# Patient Record
Sex: Female | Born: 1970 | Race: White | Hispanic: No | Marital: Married | Smoking: Current every day smoker
Health system: Southern US, Community
[De-identification: ages and names within clinical notes are randomized; demographics above are authoritative.]

## PROBLEM LIST (undated history)

## (undated) DIAGNOSIS — J449 Chronic obstructive pulmonary disease, unspecified: Secondary | ICD-10-CM

## (undated) DIAGNOSIS — I10 Essential (primary) hypertension: Secondary | ICD-10-CM

## (undated) DIAGNOSIS — C801 Malignant (primary) neoplasm, unspecified: Secondary | ICD-10-CM

## (undated) DIAGNOSIS — C3490 Malignant neoplasm of unspecified part of unspecified bronchus or lung: Secondary | ICD-10-CM

## (undated) DIAGNOSIS — C539 Malignant neoplasm of cervix uteri, unspecified: Secondary | ICD-10-CM

## (undated) HISTORY — PX: LUNG CANCER SURGERY: SHX702

## (undated) HISTORY — PX: ABDOMINAL HYSTERECTOMY: SHX81

---

## 2001-11-22 ENCOUNTER — Emergency Department (HOSPITAL_COMMUNITY): Admission: EM | Admit: 2001-11-22 | Discharge: 2001-11-22 | Payer: Self-pay | Admitting: *Deleted

## 2001-11-22 ENCOUNTER — Encounter: Payer: Self-pay | Admitting: *Deleted

## 2002-11-01 ENCOUNTER — Emergency Department (HOSPITAL_COMMUNITY): Admission: EM | Admit: 2002-11-01 | Discharge: 2002-11-01 | Payer: Self-pay | Admitting: *Deleted

## 2003-07-25 ENCOUNTER — Other Ambulatory Visit: Admission: RE | Admit: 2003-07-25 | Discharge: 2003-07-25 | Payer: Self-pay | Admitting: *Deleted

## 2003-08-03 ENCOUNTER — Inpatient Hospital Stay (HOSPITAL_COMMUNITY): Admission: RE | Admit: 2003-08-03 | Discharge: 2003-08-04 | Payer: Self-pay | Admitting: *Deleted

## 2005-01-29 ENCOUNTER — Emergency Department (HOSPITAL_COMMUNITY): Admission: EM | Admit: 2005-01-29 | Discharge: 2005-01-29 | Payer: Self-pay | Admitting: Emergency Medicine

## 2008-07-13 DIAGNOSIS — C3490 Malignant neoplasm of unspecified part of unspecified bronchus or lung: Secondary | ICD-10-CM

## 2008-07-13 HISTORY — DX: Malignant neoplasm of unspecified part of unspecified bronchus or lung: C34.90

## 2008-07-13 HISTORY — PX: LUNG CANCER SURGERY: SHX702

## 2008-10-01 ENCOUNTER — Ambulatory Visit (HOSPITAL_COMMUNITY): Admission: RE | Admit: 2008-10-01 | Discharge: 2008-10-01 | Payer: Self-pay | Admitting: Family Medicine

## 2008-10-05 ENCOUNTER — Ambulatory Visit (HOSPITAL_COMMUNITY): Admission: RE | Admit: 2008-10-05 | Discharge: 2008-10-05 | Payer: Self-pay | Admitting: Family Medicine

## 2008-10-11 ENCOUNTER — Encounter (INDEPENDENT_AMBULATORY_CARE_PROVIDER_SITE_OTHER): Payer: Self-pay | Admitting: Pulmonary Disease

## 2008-10-11 ENCOUNTER — Ambulatory Visit (HOSPITAL_COMMUNITY): Admission: RE | Admit: 2008-10-11 | Discharge: 2008-10-11 | Payer: Self-pay | Admitting: Pulmonary Disease

## 2008-10-18 ENCOUNTER — Ambulatory Visit (HOSPITAL_COMMUNITY): Admission: RE | Admit: 2008-10-18 | Discharge: 2008-10-18 | Payer: Self-pay | Admitting: Pulmonary Disease

## 2008-11-01 ENCOUNTER — Ambulatory Visit: Payer: Self-pay | Admitting: Thoracic Surgery

## 2008-11-02 ENCOUNTER — Ambulatory Visit (HOSPITAL_COMMUNITY): Admission: RE | Admit: 2008-11-02 | Discharge: 2008-11-02 | Payer: Self-pay | Admitting: Thoracic Surgery

## 2008-11-20 ENCOUNTER — Encounter: Payer: Self-pay | Admitting: Thoracic Surgery

## 2008-11-20 ENCOUNTER — Ambulatory Visit: Payer: Self-pay | Admitting: Internal Medicine

## 2008-11-20 ENCOUNTER — Inpatient Hospital Stay (HOSPITAL_COMMUNITY): Admission: RE | Admit: 2008-11-20 | Discharge: 2008-11-26 | Payer: Self-pay | Admitting: Thoracic Surgery

## 2008-11-20 ENCOUNTER — Ambulatory Visit: Payer: Self-pay | Admitting: Thoracic Surgery

## 2008-12-04 ENCOUNTER — Encounter: Admission: RE | Admit: 2008-12-04 | Discharge: 2008-12-04 | Payer: Self-pay | Admitting: Thoracic Surgery

## 2008-12-04 ENCOUNTER — Ambulatory Visit: Payer: Self-pay | Admitting: Thoracic Surgery

## 2008-12-26 ENCOUNTER — Ambulatory Visit: Payer: Self-pay | Admitting: Thoracic Surgery

## 2008-12-26 ENCOUNTER — Encounter: Admission: RE | Admit: 2008-12-26 | Discharge: 2008-12-26 | Payer: Self-pay | Admitting: Thoracic Surgery

## 2009-01-02 ENCOUNTER — Ambulatory Visit: Payer: Self-pay | Admitting: Thoracic Surgery

## 2009-01-16 ENCOUNTER — Ambulatory Visit: Payer: Self-pay | Admitting: Thoracic Surgery

## 2009-01-16 ENCOUNTER — Encounter: Admission: RE | Admit: 2009-01-16 | Discharge: 2009-01-16 | Payer: Self-pay | Admitting: Thoracic Surgery

## 2009-02-20 ENCOUNTER — Ambulatory Visit: Payer: Self-pay | Admitting: Thoracic Surgery

## 2009-02-20 ENCOUNTER — Encounter: Admission: RE | Admit: 2009-02-20 | Discharge: 2009-02-20 | Payer: Self-pay | Admitting: Thoracic Surgery

## 2009-05-12 IMAGING — CR DG CHEST 1V PORT
1 series · 1 of 1 positions shown · non-contrast
Comparison: Preoperative view earlier today.

CLINICAL DATA: Right middle lobe mass.  Right thoracotomy.

PORTABLE CHEST - 1 VIEW - at 0848 hours:

[view not recorded]
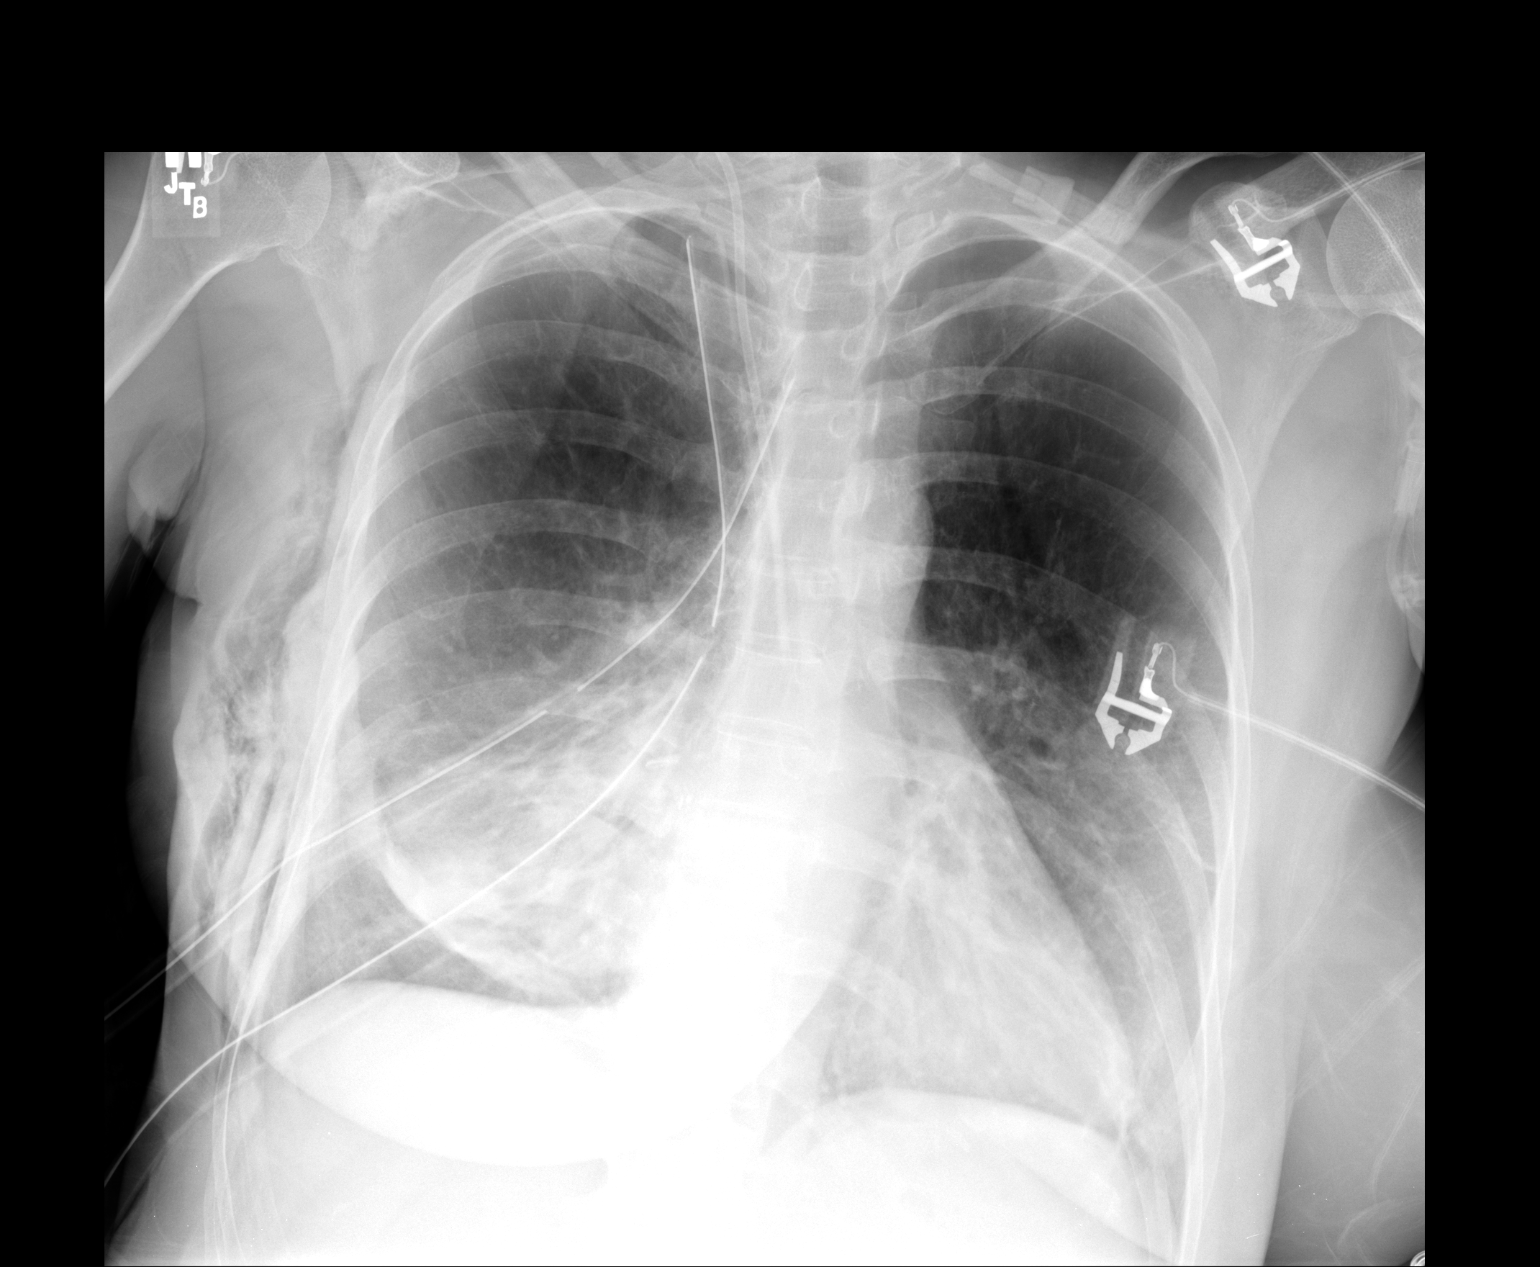

[1 of 1 positions shown; findings below may reference images not displayed]

FINDINGS: Status post right thoracotomy.  Two pleural chest tubes
are noted with tips near the right apex.  Right chest wall
subcutaneous emphysema.  Atelectatic changes right lower lung zone.
No pneumothorax.  Central venous catheter enters via right jugular
vein approach.  Catheter tip is in the mid SVC.
IMPRESSION: Status post right thoracotomy.  Atelectatic changes.  Right chest
wall subcutaneous emphysema.  Support tubes and catheters appear in
satisfactory position.

## 2009-05-13 IMAGING — CR DG CHEST 1V PORT
1 series · 1 of 1 positions shown · non-contrast
Comparison: 11/20/2008.

CLINICAL DATA: Right middle lobe mass.  Chest tubes.

PORTABLE CHEST - 1 VIEW

[AP]
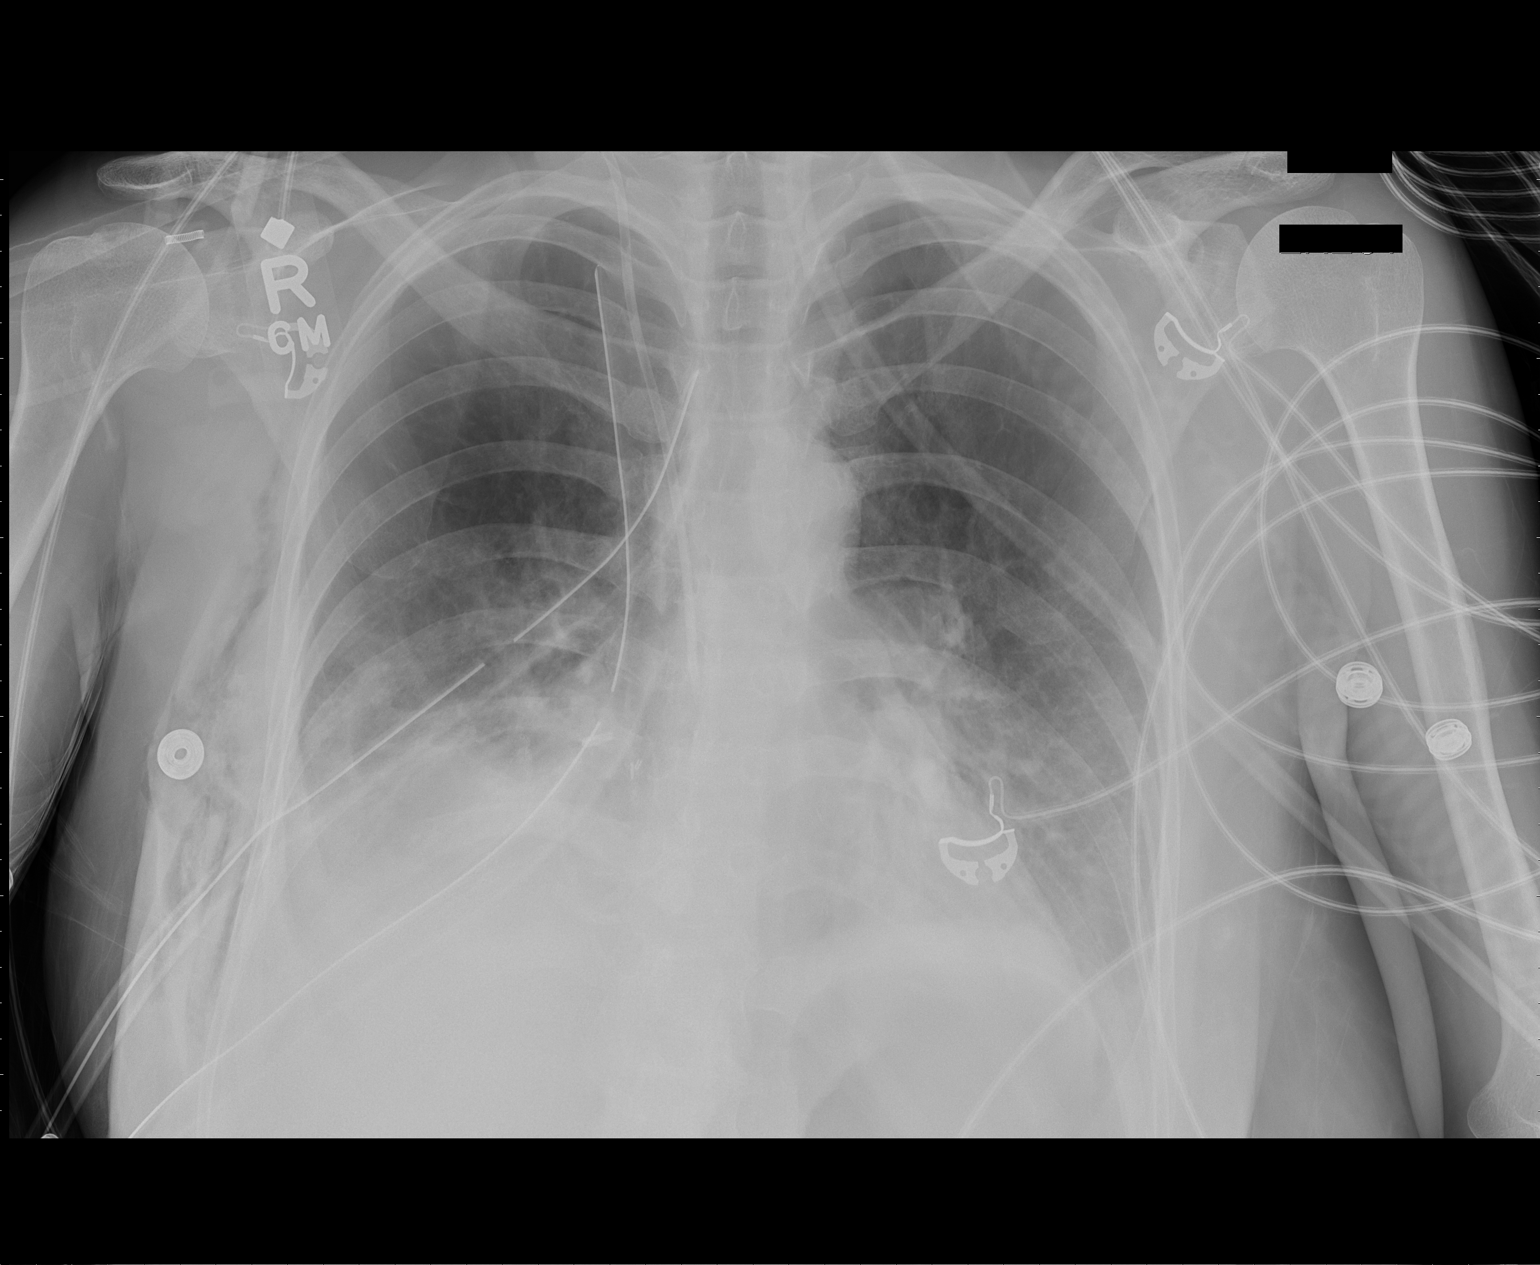

[1 of 1 positions shown; findings below may reference images not displayed]

FINDINGS: Right IJ catheter in the lower SVC is unchanged.  Lung
volumes appear lower than on yesterday's exam.  Tiny residual right
pneumothorax is present, with the pleural line intersecting the
right posterior third rib.  Chest tubes appear unchanged.
Subcutaneous emphysema has decreased slightly.  There is more
atelectasis and faint airspace disease at the lung bases
bilaterally.  Postsurgical changes are noted in the right
infrahilar region.
IMPRESSION: 1.  Unchanged support apparatus.
2.  Decreasing aeration of the lung bases with lower lung volumes.
3.  Tiny residual right apical pneumothorax.

## 2009-05-15 IMAGING — CR DG CHEST 1V PORT
1 series · 1 of 1 positions shown · non-contrast
Comparison: 11/22/2008 and earlier.

CLINICAL DATA: 37-year-old female status post VATS.  Right middle
lobe mass.

PORTABLE CHEST - 1 VIEW

[view not recorded]
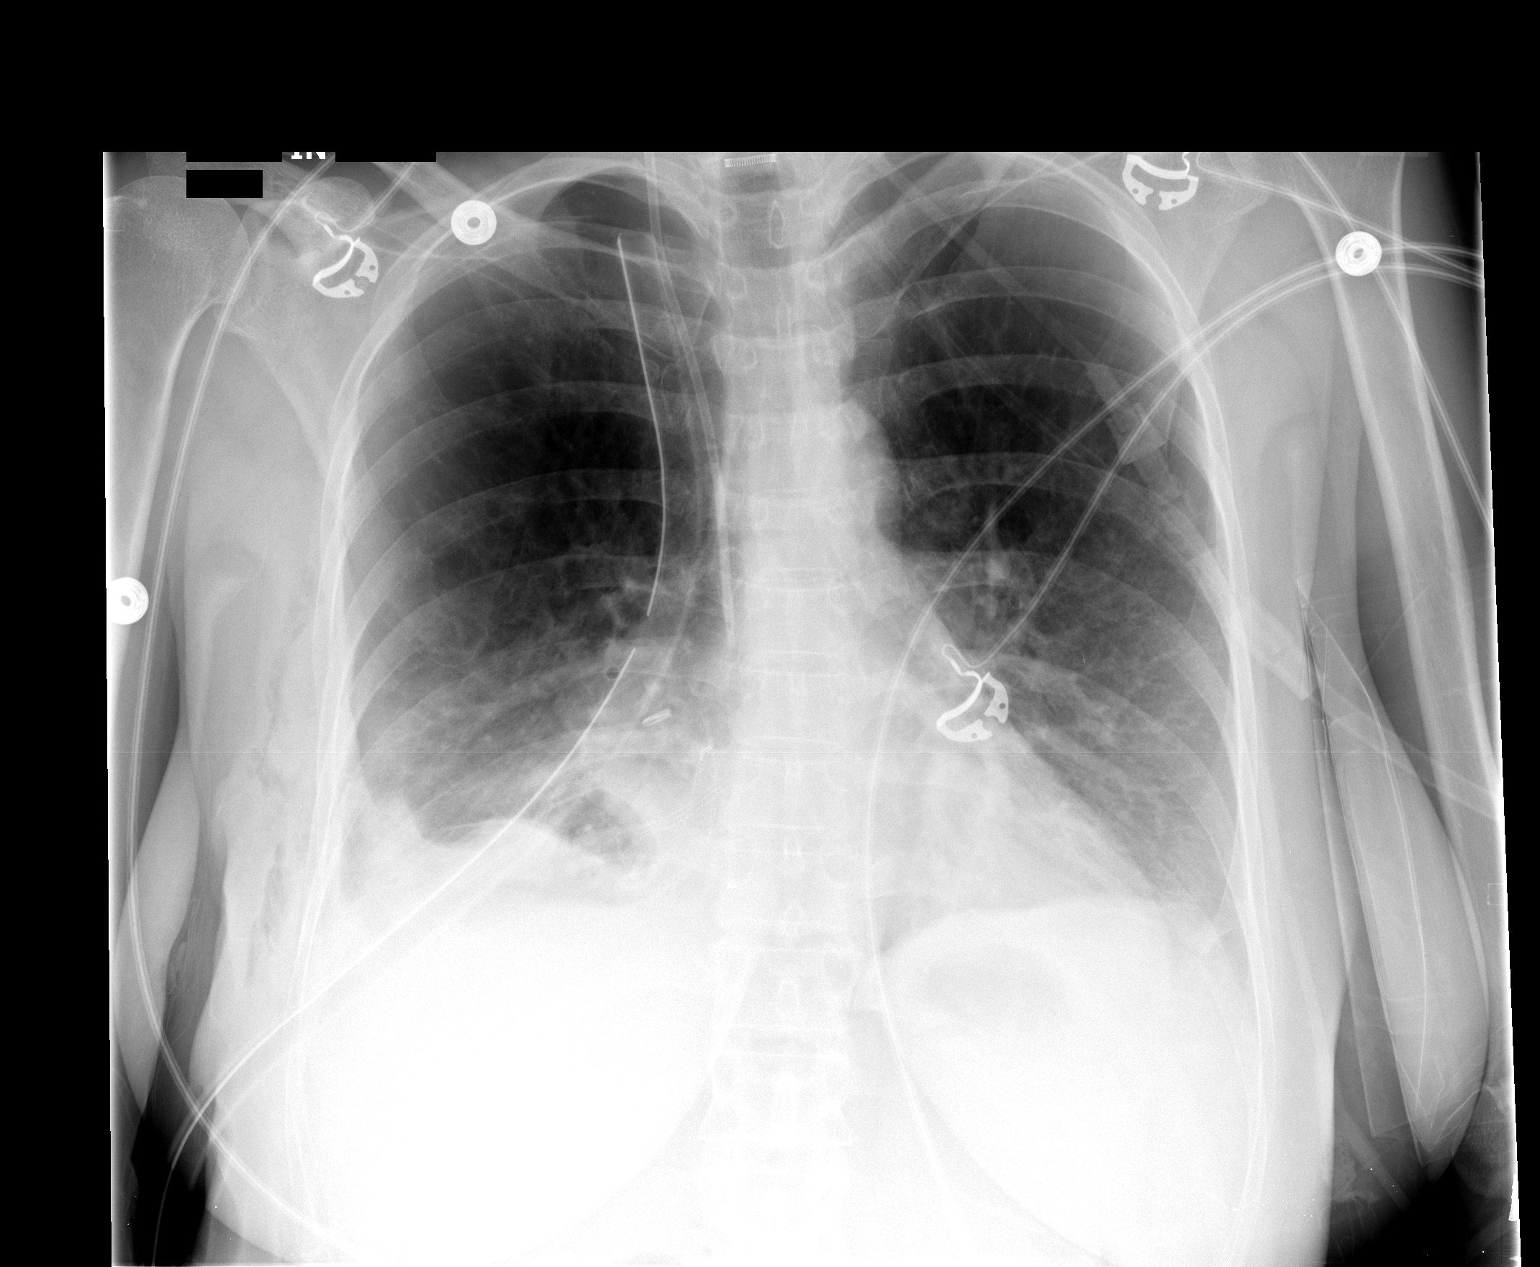

[1 of 1 positions shown; findings below may reference images not displayed]

FINDINGS: Portable upright AP view at 2222 hours.  Unchanged right
apical pneumothorax.  One of the chest tube has been removed.  The
remaining tube is stable.  Stable right chest wall subcutaneous
emphysema.  Unchanged patchy opacity at the right lung base.
Stable right IJ catheter.  Cardiac size and mediastinal contours
are within normal limits.  Stable left basilar atelectasis.
IMPRESSION: 1.  One thoracostomy tube removed in one in place with unchanged
small right apical in the thorax.
2.  Otherwise stable chest.

## 2009-07-16 ENCOUNTER — Encounter: Payer: Self-pay | Admitting: Family Medicine

## 2010-04-14 ENCOUNTER — Ambulatory Visit (HOSPITAL_COMMUNITY): Admission: RE | Admit: 2010-04-14 | Discharge: 2010-04-14 | Payer: Self-pay | Admitting: Family Medicine

## 2010-08-12 NOTE — Letter (Signed)
Summary: Historic Patient File  Historic Patient File   Imported By: Lind Guest 07/16/2009 08:13:32  _____________________________________________________________________  External Attachment:    Type:   Image     Comment:   External Document

## 2010-10-21 LAB — POCT I-STAT 3, ART BLOOD GAS (G3+)
Acid-Base Excess: 1 mmol/L (ref 0.0–2.0)
Acid-base deficit: 3 mmol/L — ABNORMAL HIGH (ref 0.0–2.0)
Acid-base deficit: 4 mmol/L — ABNORMAL HIGH (ref 0.0–2.0)
Bicarbonate: 25.5 mEq/L — ABNORMAL HIGH (ref 20.0–24.0)
O2 Saturation: 90 %
O2 Saturation: 92 %
Patient temperature: 99.1
TCO2: 27 mmol/L (ref 0–100)
pCO2 arterial: 40.3 mmHg (ref 35.0–45.0)
pCO2 arterial: 41.8 mmHg (ref 35.0–45.0)
pCO2 arterial: 55.2 mmHg — ABNORMAL HIGH (ref 35.0–45.0)
pO2, Arterial: 100 mmHg (ref 80.0–100.0)
pO2, Arterial: 64 mmHg — ABNORMAL LOW (ref 80.0–100.0)
pO2, Arterial: 64 mmHg — ABNORMAL LOW (ref 80.0–100.0)

## 2010-10-21 LAB — BASIC METABOLIC PANEL
BUN: 3 mg/dL — ABNORMAL LOW (ref 6–23)
CO2: 31 mEq/L (ref 19–32)
Calcium: 8.6 mg/dL (ref 8.4–10.5)
Calcium: 9.2 mg/dL (ref 8.4–10.5)
Creatinine, Ser: 0.41 mg/dL (ref 0.4–1.2)
GFR calc Af Amer: >60 mL/min (ref >60)
GFR calc non Af Amer: >60 mL/min (ref >60)
GFR calc non Af Amer: >60 mL/min (ref >60)
Glucose, Bld: 122 mg/dL — ABNORMAL HIGH (ref 70–99)
Glucose, Bld: 147 mg/dL — ABNORMAL HIGH (ref 70–99)
Sodium: 137 mEq/L (ref 135–145)

## 2010-10-21 LAB — GLUCOSE, CAPILLARY
Glucose-Capillary: 100 mg/dL — ABNORMAL HIGH (ref 70–99)
Glucose-Capillary: 109 mg/dL — ABNORMAL HIGH (ref 70–99)
Glucose-Capillary: 87 mg/dL (ref 70–99)
Glucose-Capillary: 97 mg/dL (ref 70–99)
Glucose-Capillary: 101 mg/dL — ABNORMAL HIGH (ref 70–99)
Glucose-Capillary: 106 mg/dL — ABNORMAL HIGH (ref 70–99)
Glucose-Capillary: 110 mg/dL — ABNORMAL HIGH (ref 70–99)
Glucose-Capillary: 117 mg/dL — ABNORMAL HIGH (ref 70–99)
Glucose-Capillary: 118 mg/dL — ABNORMAL HIGH (ref 70–99)
Glucose-Capillary: 128 mg/dL — ABNORMAL HIGH (ref 70–99)
Glucose-Capillary: 130 mg/dL — ABNORMAL HIGH (ref 70–99)
Glucose-Capillary: 91 mg/dL (ref 70–99)

## 2010-10-21 LAB — COMPREHENSIVE METABOLIC PANEL
AST: 23 U/L (ref 0–37)
Albumin: 2.9 g/dL — ABNORMAL LOW (ref 3.5–5.2)
Albumin: 4.1 g/dL (ref 3.5–5.2)
BUN: 2 mg/dL — ABNORMAL LOW (ref 6–23)
BUN: 7 mg/dL (ref 6–23)
CO2: 23 mEq/L (ref 19–32)
Calcium: 9 mg/dL (ref 8.4–10.5)
Chloride: 105 mEq/L (ref 96–112)
Creatinine, Ser: 0.45 mg/dL (ref 0.4–1.2)
Creatinine, Ser: 0.49 mg/dL (ref 0.4–1.2)
GFR calc Af Amer: >60 mL/min (ref >60)
GFR calc non Af Amer: >60 mL/min (ref >60)
Glucose, Bld: 100 mg/dL — ABNORMAL HIGH (ref 70–99)
Total Bilirubin: 0.7 mg/dL (ref 0.3–1.2)
Total Bilirubin: 0.8 mg/dL (ref 0.3–1.2)
Total Protein: 6.3 g/dL (ref 6.0–8.3)

## 2010-10-21 LAB — TYPE AND SCREEN: ABO/RH(D): B POS

## 2010-10-21 LAB — CBC
HCT: 28 % — ABNORMAL LOW (ref 36.0–46.0)
HCT: 28.9 % — ABNORMAL LOW (ref 36.0–46.0)
HCT: 36.8 % (ref 36.0–46.0)
MCHC: 34.1 g/dL (ref 30.0–36.0)
MCHC: 34.3 g/dL (ref 30.0–36.0)
MCHC: 34.3 g/dL (ref 30.0–36.0)
MCV: 85.9 fL (ref 78.0–100.0)
MCV: 87.8 fL (ref 78.0–100.0)
Platelets: 206 10*3/uL (ref 150–400)
Platelets: 229 10*3/uL (ref 150–400)
Platelets: 274 10*3/uL (ref 150–400)
RDW: 13.3 % (ref 11.5–15.5)
RDW: 13.4 % (ref 11.5–15.5)
RDW: 13.4 % (ref 11.5–15.5)
WBC: 5.3 10*3/uL (ref 4.0–10.5)
WBC: 7.3 10*3/uL (ref 4.0–10.5)

## 2010-10-21 LAB — BLOOD GAS, ARTERIAL
Bicarbonate: 22.5 mEq/L (ref 20.0–24.0)
Patient temperature: 98.6
TCO2: 23.6 mmol/L (ref 0–100)
pH, Arterial: 7.413 — ABNORMAL HIGH (ref 7.350–7.400)

## 2010-10-21 LAB — PROTIME-INR
INR: 1.1 (ref 0.00–1.49)
Prothrombin Time: 14.8 seconds (ref 11.6–15.2)

## 2010-10-21 LAB — URINALYSIS, ROUTINE W REFLEX MICROSCOPIC
Bilirubin Urine: NEGATIVE
Ketones, ur: NEGATIVE mg/dL
Nitrite: NEGATIVE
Protein, ur: NEGATIVE mg/dL
Urobilinogen, UA: 0.2 mg/dL (ref 0.0–1.0)

## 2010-10-21 LAB — APTT: aPTT: 33 seconds (ref 24–37)

## 2010-11-25 NOTE — Discharge Summary (Signed)
NAMESynetta Fail [name]                   ACCOUNT NO.:  [id number]   MEDICAL RECORD NO.:  [id number]          PATIENT TYPE:  INP   LOCATION:  2003                         FACILITY:  [facility]   PHYSICIAN:  Ines Bloomer, M.D. DATE OF BIRTH:  [birth year]   DATE OF ADMISSION:  11/20/2008  DATE OF DISCHARGE:  11/26/2008                               DISCHARGE SUMMARY   HISTORY:  The patient is a 40 year old female referred to Dr. Edwyna Shell for  thoracic surgical consultation.  She initially presented with complaints  of chest pain and shortness of breath.  A chest x-ray was obtained,  which revealed findings consistent with right lower lobe occlusion and a  possible mass.  She underwent bronchoscopy, which was negative for  malignancy, showing inflammation and fibrosis.  She was found to have  occlusion of the right middle lobe.  PET scan was done, which was  negative.  She has a long history of tobacco abuse but is trying to  quit.  Primary symptoms also included chest wall pain and cough.  It was  Dr. Scheryl Darter opinion that this could possibly be a carcinoid tumor and  recommended resection.  She was admitted this hospitalization for the  procedure.   PAST MEDICAL HISTORY:  Macular degeneration and adult ADHD as well as  tobacco abuse.   ALLERGIES:  None.   MEDICATIONS PRIOR TO ADMISSION:  1. Adderall 20 mg one-half tablet 3 times daily.  2. Albuterol nebs 4 times daily.  3. Hydrocodone 5/500 mg p.r.n.   FAMILY HISTORY, SOCIAL HISTORY, REVIEW OF SYMPTOMS, AND PHYSICAL  EXAMINATION:  Please see dictated history and physical done at the time  of admission.   HOSPITAL COURSE:  The patient was admitted electively and on Nov 20, 2008, taken to the operating room, at which time she underwent a right  video-assisted thoracoscopy and minithoracotomy with right middle lobe  lobectomy and lymph node sampling.  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  The patient  tolerated the  procedure well, was taken to the Postanesthesia Care Unit in stable  condition.  Postoperative hospital course, the patient has progressed  nicely.  All routine lines, monitors, drainage devices have been  discontinued in a standard fashion.  She has required aggressive  pulmonary toilet, including early on she did require use of BiPAP.  This  has stabilized and improved over time.  Currently, she maintains good  oxygen saturations on room air.  She was seen in conjunction  postoperatively with [facility], who assisted with her  management.  She does have a moderate acute blood loss anemia, but this  has stabilized.  Most recent hemoglobin and hematocrit dated Nov 23, 2008, are 10.1 and 29 respectively.  Electrolytes, BUN, and creatinine  are within normal limits.  Chest x-ray showed atelectasis and airspace  disease in the bases, but this is improving with time.  She is  tolerating a routine advancement in activities using standard protocols.  Her incision is healing well without evidence of infection.  She did  have  an alpha-1 antitrypsin level which was elevated at 335.  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  Please see  the complete pathology report for full details.  Tentatively, the  patient is scheduled for discharge in the morning of Nov 26, 2008,  pending morning round reevaluation.   Medications at the time of discharge will include the following:  Oxycodone 5 mg 1-2 every 4-6 hours as needed for pain.  She will resume  her albuterol nebulizer treatments q.i.d. as well as her home Adderall  dosing of 20 mg one-half tablet t.i.d.   Followup will include an appointment to see Dr. Edwyna Shell in 1 week, this  will be called to the patient from the office.  We will obtain a chest x-  ray at that time.   CONDITION ON DISCHARGE:  Stable and improving.   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   OTHER MEDICAL DIAGNOSES:  1. Adult attention-deficit/hyperactivity disorder.  2. History of macular degeneration.  3. History of tobacco abuse.      Rowe Clack, P.A.-C.      Ines Bloomer, M.D.  Electronically Signed    WEG/MEDQ  D:  11/25/2008  T:  11/25/2008  Job:  [id number]   cc:   [facility]  Ines Bloomer, M.D.

## 2010-11-25 NOTE — Letter (Signed)
Dec 04, 2008   Edward L. Juanetta Gosling, MD  [address]  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  There were  no evidence spread to the lymph node, so, she was a stage I.  She comes  back today, we removed her chest tube sutures.  She is doing well  overall.  We gave her a refill for Vicodin #60.  We will see her back  again in 3 weeks with a chest x-ray.   Sincerely,   Ines Bloomer, M.D.  Electronically Signed   DPB/MEDQ  D:  12/04/2008  T:  12/05/2008  Job:  [id number]

## 2010-11-25 NOTE — Letter (Signed)
November 01, 2008   Edward L. Juanetta Gosling, MD  [address]  [place], [place] [address]   Re:  [name], [name]             DOB:  11-22-1970   Dear Renae Fickle:   I appreciate the opportunity of seeing the patient.  This 40 year old  patient has a long history of smoking and multiple other medical  problems.  She complained of chest pain, shortness of breath and got a  chest x-ray, which revealed a right middle lobe occlusion and a possible  mass.  Bronchoscopy showed right middle lobe lesion with washings,  brushings, and biopsies were negative, just showed inflammation and  fibrosis.  PET scan was done, which was negative.  She has had a lot of  coughing recently as well as the pain.  She is taking Chantix and trying  to quit smoking.  She has had been taking Celebrex for the chest wall  pain.   PAST MEDICAL HISTORY:  Positive for macular degeneration.  She has HTHD  and no allergies.  Currently taking Chantix, , and hydrocodone p.r.n.   FAMILY HISTORY:  Unknown.  She is adopted.   SOCIAL HISTORY:  She smokes 25 packs a day.  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  Does not drink alcohol on a regular  basis.   REVIEW OF SYSTEMS:  She weighs 109 pounds.  She is 5 feet 2.  She has  had some weight gain.  She gets shortness of breath when lying flat.  No  angina or atrial fibrillation.  Pulmonary:  She has had a cough and  wheezing.  GI:  No nausea, vomiting, constipation, or diarrhea.  GU:  No  kidney disease, dysuria, or frequent urination.  Vascular:  No  claudication, DVT, or TIAs.  Neurological:  No dizziness, blackouts, or  seizures, but has headache.  Musculoskeletal:  Some arthritis.  Psychiatric:  Nervousness.  Eyes/ENT:  No change in her eyesight or  hearing.  Hematological:  No problems with bleeding, clotting disorders,  anemia.   PHYSICAL EXAMINATION:  GENERAL:  She is well-developed, well-nourished  female in no acute distress.  HEAD, EYES, EARS, NOSE, AND THROAT:   Unremarkable.  NECK:  Supple without thyromegaly.  CHEST:  Clear to auscultation and percussion on the left and some  questionable wheezing on the right.  HEART:  Regular sinus rhythm.  No murmurs.  ABDOMEN:  Soft.  No hepatosplenomegaly.  EXTREMITIES:  Pulses 2+.  There is no clubbing or edema.  NEUROLOGICAL:  She is oriented x3.  Sensory and motor intact.  Cranial  nerves intact.   I discussed the situation with her, and I agree that this is probably a  carcinoid tumor which would be the most likely situation.  It is not she  has some type of obstruction in her middle lobe.  The middle lobe is  completely destroyed.  I think, the best thing to do is to proceed with  a VATS, right middle lobectomy and we plan to do this on the 19th at  [facility].  I appreciate the opportunity of taking care of the  patient.   Sincerely,   Ines Bloomer, [name].D.  Electronically Signed   DPB/MEDQ  D:  11/01/2008  T:  11/02/2008  Job:  [id number]   cc:   Melvyn Novas, MD

## 2010-11-25 NOTE — Letter (Signed)
February 20, 2009   Edward L. Juanetta Gosling, MD  [address]  [place], [place] [address]   Re:  [name], [name]             DOB:  02-28-1971   Dear Dr. Juanetta Gosling:   The patient returned for a final followup today.  Her blood pressure is  100/65, pulse 89, respirations 18, sats were 97%.  She has returned to  work.  Her incisions are well healed.  We will refer her back to you for  any long-term followup.  I appreciate the opportunity of seeing the  patient.   Sincerely,   Ines Bloomer, [name].D.  Electronically Signed   DPB/MEDQ  D:  02/20/2009  T:  02/21/2009  Job:  [id number]

## 2010-11-25 NOTE — Op Note (Signed)
NAMESynetta Fail [name]                   ACCOUNT NO.:  [id number]   MEDICAL RECORD NO.:  [id number]          PATIENT TYPE:  AMB   LOCATION:  DAY                           FACILITY:  [facility]   PHYSICIAN:  Edward L. Juanetta Gosling, M.D.DATE OF BIRTH:  February 18, 1971   DATE OF PROCEDURE:  DATE OF DISCHARGE:                               OPERATIVE REPORT   INDICATIONS FOR PROCEDURE:  Ms. Jill Fail is a 40 year old who developed  cough and who had a chest x-ray and then CT scan, which were consistent  with a possible mass lesion in her right lung.  She is undergoing  bronchoscopy to try to establish a diagnosis.  There were no  contraindications to planned procedure.   PREOPERATIVE DIAGNOSIS:  Abnormal chest x-ray.   POSTOPERATIVE DIAGNOSIS:  Abnormal chest x-ray with right middle lobe  occlusion.   PROCEDURE:  Fiberoptic bronchoscopy with biopsy brushings and washings.   SURGEON:  Edward L. Juanetta Gosling, MD   ANESTHESIA:  Conscious sedation.   BODY OF THE REPORT:  After satisfactory local anesthesia and 10 mg of  Versed intravenously, the bronchoscope was introduced through the right  nares.  The upper airway was inspected and found to be within normal  limits.  The vocal chords were identified and anesthetized with  Xylocaine.  The bronchoscope was then advanced into the trachea.  The  tracheal mucosa was markedly erythematous with great deal of bronchial  pitting suggesting chronic bronchitic changes.  The left lung was  inspected and found to be within normal limits.  The right lung was then  inspected and the area of the right middle lobe, there was what appeared  to be a mass lesion.  It was somewhat mucoid in appearance, but I  believe it is a mass as opposed to mucus plugging.  Multiple biopsies,  brushings, and washings were made.  The patient tolerated the procedure  well with cough and was taken to the recovery room in good condition.      Edward L. Juanetta Gosling, M.D.  Electronically Signed     ELH/MEDQ  D:  10/11/2008  T:  10/12/2008  Job:  [id number]   cc:   Melvyn Novas, MD  Fax: [phone]

## 2010-11-25 NOTE — Op Note (Signed)
NAMESynetta Fail [name]                   ACCOUNT NO.:  [id number]   MEDICAL RECORD NO.:  [id number]          PATIENT TYPE:  INP   LOCATION:  2003                         FACILITY:  [facility]   PHYSICIAN:  [name], M.D. DATE OF BIRTH:  19-Aug-1970   DATE OF PROCEDURE:  DATE OF DISCHARGE:  11/26/2008                               OPERATIVE REPORT   PREOPERATIVE DIAGNOSIS:  Atelectasis, right lung.   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   OPERATION:  Right VATS lobectomy.   SURGEON:  Jill Bloomer, MD   FIRST ASSISTANT:  [name], [facility]   ANESTHESIA:  General anesthesia.   After percutaneous insertion of all monitoring lines, the patient under  general anesthesia was turned to the left lateral thoracotomy position  and was prepped and draped in the usual sterile manner.  A dual-lumen  tube was inserted, right lung was deflated.  The patient returned to the  right lateral thoracotomy position and was prepped and draped in usual  sterile manner.  A dual-lumen tube was inserted and 2 trocar sites were  made in the anterior axillary line at the seventh intercostal space, and  the posterior axillary line at the eighth intercostal space.  A 0-degree  scope was inserted and lung was deflated.  There were a lot of adhesions  of the middle lobe secondary to inflammation and these had to be taken  down with electrocautery.  A third incision was made over the sixth  intercostal space and approximately 3-4 cm anteriorly as an access  incision.  The right middle lobe and right lower lobe were taken off the  mediastinum and the diaphragm, and then off the chest wall laterally.  The minor fissure was somewhat incomplete.  The major fissure was almost  complete.  Dissection was started anteriorly dissecting out the superior  pulmonary vein branch in the middle lobe.  It was stapled and divided  with an Systems analyst.  I then turned our  attention to the inferior  portion of the minor fissure and this was just the small amount of  tissue, which was dissected free, stapled with an Autosuture, 30 blue  Roticulator.  This exposed the bronchus and several nodes around the  bronchus, 11R nodes dissected from the bronchus.  The patient had only  one branch of middle lobe artery instead of two.  This is a fairly large  artery and this was identified as well as the bronchus was identified.  Prior to doing this, we divided the minor fissure with the Autosuture 60  green stapler with 2 applications.  This left just a small amount of  tissue around the right middle lobe artery.  The right middle lobe  artery was dissected off bronchus, stapled, and divided with the  Autosuture, 2-mm stapler and then the rest of the fissure was divided  with an Autosuture 45-mm stapler.  This left the bronchus which was  excised with knife.  We could not staple it because of the closeness of  the tumor.  We opened  it and could see the tumor right at the orifice  and then he had at least 2-to 3-mm margin around the tumor and we  excised the middle lobe.  We then closed the right middle lobe with  interrupted 4-0 Prolene in interrupted fashion.  A several 10L nodes  were dissected free.  We checked for an air leak under water and none  was found.  We then closed 2 chest tubes into the trocar sites, tied in  place with 0 silk, a single On-Q in the usual  fashion.  Marcaine block in the usual fashion.  The chest was closed  with 2 pericostals, #1 Vicryl in the muscle layer, 2-0 Vicryl in the  subcutaneous tissue, and 3-0 Vicryl in subcuticular stitch.  The patient  was returned to the recovery room in stable condition.      [name], M.D.  Electronically Signed     DPB/MEDQ  D:  12/17/2008  T:  12/18/2008  Job:  [id number]

## 2010-11-25 NOTE — Op Note (Signed)
NAMESynetta Fail [name]                   ACCOUNT NO.:  [id number]   MEDICAL RECORD NO.:  [id number]           PATIENT TYPE:   LOCATION:                                 FACILITY:   PHYSICIAN:  Ines Bloomer, M.D. DATE OF BIRTH:  1971-01-30   DATE OF PROCEDURE:  DATE OF DISCHARGE:                               OPERATIVE REPORT   PREOPERATIVE DIAGNOSIS:  Right lower lobe atelectasis secondary to  bronchial tumor.   POSTOPERATIVE DIAGNOSIS:  Right lower lobe atelectasis secondary to  carcinoid tumor.   OPERATION PERFORMED:  Right video-assisted thoracic surgery middle  lobectomy.   SURGEON:  Ines Bloomer, MD   FIRST ASSISTANT:  Margarito Liner, PA-C, general anesthesia.   After percutaneous tissue all monitoring lines, the patient underwent  general anesthesia and was prepped and draped in usual sterile manner.  Was turned to the right lateral thoracotomy position.  A dual-lumen tube  was inserted.  The right lung was deflated.  Two trocar sites were made  in the anterior and posterior axillary line, one at the seventh  intercostal space at the anterior axillary line, and one at the  posterior axillary line at the eighth intercostal space.  Two trocars  were inserted.  A 0-degree scope was inserted.  There were a lot of  adhesions in the middle lobe, in the medial portion of the lower lobe to  the diaphragm and the lung, and a third incision was made over the sixth  intercostal space anteriorly of approximately 5 cm.  The serratus was  split and through this access incision, we used electrocautery to take  down the adhesions off the chest wall, off the diaphragm, and off the  mediastinum.  Then turned our attention to the right middle lobe vein.  This was dissected out, stapled, and divided with an Autosuture 2-mm  stapler.  The inferior portion of the major fissure, which was a small  tissue was dissected free and divided with the Autosuture 45-mm stapler.  Several 11R nodes  were dissected from around the bronchus.  Then the  attention was turned to the minor fissure and that was divided with the  [facility] 60 stapler, several applications.  This exposed the right  middle lobe artery, which was just one artery and that was dissected  free from some 10R nodes, stapled, and divided with the Autosuture  stapler.  This left the bronchus free and we excised with Boker knife.  Because we could not staple it because of the intrabronchial tumor.  About 2-3 middle margins were attained around the tumor and the  bronchial stump was closed with interrupted three 4-0 Prolene in  interrupted fashion.  A CoSeal was applied to the staple line.  No air  leak was seen from the bronchial stump.  Two chest tubes were brought  through the trocar sites, tied in place with 0 silk.  A single On-Q was  inserted in the usual fashion.  Marcaine block was done in the usual  fashion.  Chest was closed with 2 pericostal #1 Vicryl in muscle layer,  2-0 Vicryl in subcutaneous tissue, and 3-0 Vicryl in subcuticular  stitch.  The patient was returned to the recovery room in stable  condition.      Ines Bloomer, M.D.  Electronically Signed     DPB/MEDQ  D:  12/17/2008  T:  12/18/2008  Job:  [id number]

## 2010-11-25 NOTE — Assessment & Plan Note (Signed)
OFFICE VISIT   [name], [name]  DOB:  [birth year]                                        January 02, 2009  CHART #:  [id number]   The patient came today and she has finished her Keflex.  She is still  having some moderate pain, but her incision is now healing better with  no evidence of any recurrent infection.  Her blood pressure was 116/71,  pulse 94, respirations 18, and sats were 95%.  I will see her back in 2  weeks and at that time, release to return to work.   Ines Bloomer, M.D.  Electronically Signed   DPB/MEDQ  D:  01/02/2009  T:  01/03/2009  Job:  [id number]

## 2010-11-25 NOTE — H&P (Signed)
NAMESynetta Fail [name]                   ACCOUNT NO.:  [id number]   MEDICAL RECORD NO.:  [id number]          PATIENT TYPE:  INP   LOCATION:                               FACILITY:  [facility]   PHYSICIAN:  Ines Bloomer, M.D. DATE OF BIRTH:  1970-08-01   DATE OF ADMISSION:  11/20/2008  DATE OF DISCHARGE:                              HISTORY & PHYSICAL   CHIEF COMPLAINT:  Right middle lobe occlusion.   HISTORY OF PRESENT ILLNESS:  This 40 year old patient has a long history  of smoking and multiple other medical problems including a macular  degeneration, cigarette abuse, and ADHD.  She was complaining of chest  pain, shortness of breath, and an x-ray showed right lower lobe  occlusion, possible mass.  She underwent a bronchoscopy, which showed a  right middle lobe lesions with biopsies were negative, showed  inflammation and fibrosis, but she continues to have occlusion of the  right middle lobe.  A PET scan was also done, which was negative.  She  is taking Chantix right now trying to quit smoking.  She has had a lot  of cough, and she also takes Celebrex for chest wall pain.   PAST MEDICAL HISTORY:  She has macular degeneration, ADHD.   She has no allergies.   MEDICATIONS:  Hydrocodone and Chantix.   FAMILY HISTORY:  Unknown.  She is adopted.   SOCIAL HISTORY:  She works in [employer] for [employer].  Does  not drink alcohol on a regular basis.  She continues to smoke and has 25-  pack years of smoking.   REVIEW OF SYSTEMS:  GENERAL:  She is 109 pounds, 5 feet 2 inches.  She  had some recent weight gain.  CARDIAC:  No angina or atrial  fibrillation.  PULMONARY:  Shortness of breath on lying flat.  She has  had a cough, wheezing.  No hemoptysis.  GASTROINTESTINAL:  No nausea,  vomiting, constipation, or diarrhea.  GU:  No kidney disease, dysuria,  or frequent urination.  VASCULAR:  No claudication, DVT, or TIAs.  NEUROLOGIC:  No dizziness, headaches, blackouts, or  seizures.  MUSCULOSKELETAL:  She has some arthritis.  PSYCHIATRIC:  She has been  treated for nervousness.  EYE AND ENT:  No changes in eyesight or  hearing.  HEMATOLOGIC:  No bleeding, clotting disorders, or anemia.   PHYSICAL EXAMINATION:  VITAL SIGNS:  Her blood pressure is 114/69, pulse  100, respirations 18, sats were 97%.  HEENT:  Head is atraumatic.  Eyes, pupils equal and reactive to light  and accommodation.  Ears, tympanic membranes are intact.  Nose, there is  no septal deviation.  Throat is without lesion.  There is no  supraclavicular or axillary adenopathy.  NECK:  Supple.  CHEST:  Clear to auscultation and percussion.  HEART:  Regular sinus rhythm.  No murmurs.  ABDOMEN:  Soft.  There is no hepatosplenomegaly.  EXTREMITIES:  Pulses 2+.  There is no clubbing or edema.  NEUROLOGIC:  He is oriented x3.  Sensory and motor intact.  Cranial  nerves intact.  IMPRESSION:  1. 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  2. Attention deficit hyperactivity disorder.  3. Macular degeneration.  4. Chronic obstructive pulmonary disease.  5. Cigarette abuse.   PLAN:  Right VATS, right middle lobectomy with prior bronchoscopy.      Ines Bloomer, M.D.  Electronically Signed     DPB/MEDQ  D:  11/16/2008  T:  11/17/2008  Job:  [id number]

## 2010-11-25 NOTE — Letter (Signed)
December 26, 2008   Edward L. Juanetta Gosling, MD  [address]  [place], [place] [address]   Re:  [name], [name]             DOB:  [birth year]   Dear Ed:   I saw the patient back today.  Unfortunately, she has developed some  cellulitis of her incision.  Apparently, it is started a week ago and  last night it started draining.  I debrided the incision and started on  doxycycline 100 mg twice a day.  This should heal without too much of  her problem, but I will follow her weekly until then.  I will see her  back again in 1 week.  Her chest x-ray today showed normal postoperative  changes.   Sincerely,   Ines Bloomer, [name].D.  Electronically Signed   DPB/MEDQ  D:  12/26/2008  T:  12/26/2008  Job:  [id number]

## 2010-11-25 NOTE — Letter (Signed)
January 16, 2009   Edward L. Juanetta Gosling, MD  [address]  [place], [place] [address]   Re:  [name], [place] M             DOB:  [birth year]   Dear Renae Fickle;   I saw the patient back today.  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  Her incisions are well healed.  Her  chest x-ray showed normal postoperative changes.  I release her to  return to work next week.  Gave her a prescription for Ambien 5 mg  because of trouble sleeping, and we will see her back again in 6 weeks  with a chest x-ray.  Lungs are clear to auscultation and percussion.    Sincerely,   Ines Bloomer, M.D.  Electronically Signed   DPB/MEDQ  D:  01/16/2009  T:  01/17/2009  Job:  [id number]

## 2010-11-28 NOTE — Discharge Summary (Signed)
NAMESynetta Fail [name]                               ACCOUNT NO.:  [id number]   MEDICAL RECORD NO.:  [id number]                  PATIENT TYPE:   LOCATION:                                       FACILITY:   PHYSICIAN:  Langley Gauss, M.D.                DATE OF BIRTH:   DATE OF ADMISSION:  08/03/2003  DATE OF DISCHARGE:  08/04/2003                                 DISCHARGE SUMMARY   Actually this is not an admission.  The patient had outpatient surgery on  August 03, 2003 with outpatient stay continued to August 04, 2003.   DISPOSITION AT THE TIME OF DISCHARGE:  The patient is advised to follow up  in the office in 5 days time for staple removal from the transverse  incisions.  She was given a copy of the standardized discharge instructions.   DISCHARGE MEDICATIONS:  The patient has previously been given p.o. Tylox for  pain relief.   PERTINENT LABORATORY STUDIES:  Admission hemoglobin/hematocrit 13.6/40.1  with a white count of 5.3.  On postoperative day #1 hemoglobin/hematocrit  10.3/30.9 with a white count of 5.3.   HOSPITAL COURSE:  The patient was taken to the ambulatory surgical unit on  August 03, 2003.  The laparoscopy was performed without complications.  Bilateral BSO was performed as well as lysis of adhesions.  Postoperatively  the patient did well. She had excellent urine output.  Foley catheter was  removed on the p.m. of surgery. She was tolerating good p.o. intake and did  well with p.o. Tylox, thus IV was discontinued and the peripheral access  device was discontinued.  The patient as fully ambulatory, able to travel up  and down the floors to consume cigarettes, thus on August 04, 2003 upon  examination there was noted to be no distention of the abdomen. There was  minimal bruising at the surgical sites. Thus, the patient is discharged to  home on August 04, 2003.     ___________________________________________                                         Langley Gauss, M.D.   DC/MEDQ  D:  08/04/2003  T:  08/04/2003  Job:  [id number]

## 2010-11-28 NOTE — Discharge Summary (Signed)
[name], [name]                             ACCOUNT NO.:  [id number]   MEDICAL RECORD NO.:  [id number]                   PATIENT TYPE:  INP   LOCATION:  A407                                 FACILITY:  [facility]   PHYSICIAN:  Langley Gauss, M.D.                DATE OF BIRTH:  1970-09-29   DATE OF ADMISSION:  08/03/2003  DATE OF DISCHARGE:  08/04/2003                                 DISCHARGE SUMMARY   DISPOSITION:  At the time of discharge, the patient does have staples in  place on small laparoscopic incision.  She will follow up in the office in 5  days' time for staple removal.   DISCHARGE MEDICATIONS:  The patient was given a prescription for Tylox taken  both preoperatively and postoperatively.  The patient in addition received  IV Ancef during this hospitalization during the perioperative period.   LABORATORY DATA:  Pertinent laboratory studies, admission hemoglobin and  hematocrit 13.6/40.1 with a white count of 5.3.  On postoperative day #1,  hemoglobin 10.3, hematocrit 30.9 with a white count of 5.3.   HOSPITAL COURSE:  See previous dictations.  Laparoscopy was performed on  August 03, 2003.  Postoperatively, the patient did well.  She remained  afebrile.  She was ambulatory.  Discharged to home following this  observation status on August 04, 2003.     ___________________________________________                                         Langley Gauss, M.D.   DC/MEDQ  D:  08/06/2003  T:  08/06/2003  Job:  [id number]

## 2010-11-28 NOTE — H&P (Signed)
[name], [name]                             ACCOUNT NO.:  [id number]   MEDICAL RECORD NO.:  [id number]                   PATIENT TYPE:  AMB   LOCATION:  DAY                                  FACILITY:  [facility]   PHYSICIAN:  Langley Gauss, M.D.                DATE OF BIRTH:  [birth year]   DATE OF ADMISSION:  08/03/2003  DATE OF DISCHARGE:                                HISTORY & PHYSICAL   HISTORY OF PRESENT ILLNESS:  This is a 40 year old gravida 3, para 3, who is  referred to our outpatient surgical unit for laparoscopy with removal of  bilateral fallopian tubes and ovaries.  The patient is planning on overnight  stay, however, still on an outpatient basis.  The patient complains of right  ovarian pain and is noted to have abnormal ultrasonographic findings.   Patient initially seen by me, March 02, 2002, for annual examination, at  which time she had no complaints of any pain and was noted to have a  surgically absent uterus.  However, subsequently, the patient was seen  April 25, 2003 on an emergent basis complaining of right recurrent right  adnexal pain, very severe at time of presentation.  The patient was noted to  have been seen at [facility] ER, at which time ultrasound had revealed a  complex cyst in the right ovary, a complex cyst that measured approximately  1.3 x 1.3 x 1.4 cm.  Recommendation per the radiologist is that followup  ultrasound be performed in 2 months' time to assess for resolution of this  lesion.  I did recommend this when I saw the patient, however, thereafter  she states that the pain improved and thus she did not keep her followup  obligation.  However, subsequently, the patient did have recurrent pain.  She presented to our office on an emergent basis on July 22, 2002,  complaining of right ovarian pain x2 weeks' duration.  At that time, the  ultrasound performed here in the office was essentially unchanged.  The pain  was described as very sharp,  at times almost enough to double her over in  pain.  She also experienced deep penetration dyspareunia, such that she  prohibited intercourse.  In addition, that pain was reproducible upon  examination.   PAST MEDICAL HISTORY:  1. She does have hidradenitis suppurativa for which she is currently taking     tetracycline; this is noted to be limited to the groin area and does not     involve axillary region.  2. The patient also suffers from macular degeneration, is currently legally     blind, however, she does see shadows very well.  3. She also experiences chronic tension-type headaches, has previously been     seen by Dr. Darleen Crocker A. [place], who, of course, discontinued her narcotics,     stating they cause rebound headaches.  He put her  on indomethacin,     Depakote and Maxalt, however, she states that none of these provided any     significant therapeutic result and left her feeling like a zombie, so she     discontinued use of all those medications.  4. The patient has had 3 prior vaginal deliveries.  5. The patient has had a prior vaginal hysterectomy performed in 1992.  By     patient history, it sounds as though there were precancerous lesions of     the cervix.   ALLERGIES:  The patient states she has no known drug allergies.   CURRENT MEDICATIONS:  1. Paxil 12.5 mg p.o. daily.  2. Tetracycline for the hidradenitis suppurativa.  3. Hydrocodone 10/500 mg -- she is currently taking 1 p.o. q.6 h. for pain     associated with the right lower quadrant.   SOCIAL HISTORY:  The patient is a cigarette smoker.  She is currently  separated from her husband.   PHYSICAL EXAMINATION:  GENERAL:  Healthy-appearing cigarette smoker.  VITAL SIGNS:  Pulse of 98, 122/78, 122 pounds, respiratory rate is 20.  HEENT:  Negative.  NECK:  No adenopathy.  Neck is supple.  Thyroid is nonpalpable.  LUNGS:  Lungs are clear.  CARDIOVASCULAR:  Regular rate and rhythm.  ABDOMEN:  The abdomen is soft and  nontender.  No surgical scars are  identified.  EXTREMITIES:  Extremities are noted to be normal.  PELVIC:  Normal external genitalia.  No lesions or ulcerations identified.  The bladder is well-supported.  No urethral detachment is identified.  The  vaginal cuff is well-supported.  Nodular lesion in the left upper portion of  the vaginal cuff has been biopsied, which revealed only fibrosis.  Bimanual  examination:  The uterus is noted to be surgically absent.  The left adnexa  is nonpalpable and there is no significant tenderness appreciated.  Palpation of the right adnexa reveals a palpably, slightly increased-sized  uterus which is tender to manipulation and freely mobile.  No free fluid  identified within the pelvis.   ASSESSMENT AND PLAN:  Transvaginal ultrasound again reveals the complex  cystic and solid mass within the right ovary, very small in nature,  essentially unchanged from previous.  This probably represents hemorrhagic  ovarian cyst, however, has been managed expectantly and has failed to  resolve.  At this point in time, the patient is referred for a laparoscopy  with bilateral salpingo-oophorectomy.  She is aware that this will result in  onset of surgical menopause, requiring initiation of hormonal replacement  therapy to avoid symptoms associated with surgical menopause, risks and  benefits of this surgery discussed with the patient to include risks of  bowel or bladder injury or hemorrhage, to be referred through outpatient  surgical unit, August 03, 2003, with planned overnight stay.     ___________________________________________                                         Langley Gauss, M.D.   DC/MEDQ  D:  08/03/2003  T:  08/03/2003  Job:  [id number]

## 2010-11-28 NOTE — Op Note (Signed)
NAMEMILEVA, [name]                             ACCOUNT NO.:  [id number]   MEDICAL RECORD NO.:  [id number]                   PATIENT TYPE:  INP   LOCATION:  A407                                 FACILITY:  [facility]   PHYSICIAN:  Langley Gauss, M.D.                DATE OF BIRTH:  [birth year]   DATE OF PROCEDURE:  08/03/2003  DATE OF DISCHARGE:                                 OPERATIVE REPORT   PREOPERATIVE DIAGNOSES:  1. Bilateral adnexal type pelvic pain right side greater than left.  2. Abnormal ultrasound with right ovary showing hypoechoic area at the     superior most portion of the right ovary.   POSTOPERATIVE DIAGNOSES:  1. Bilateral adnexal type pelvic pain right side greater than left.  2. Abnormal ultrasound with right ovary showing hypoechoic area at the     superior most portion of the right ovary.   PROCEDURE:  Laparoscopy with lysis of adhesions and bilateral salpingo-  oophorectomy.   SURGEON:  Langley Gauss, M.D.   ESTIMATED BLOOD LOSS:  100 mL   COMPLICATIONS:  None.   SPECIMENS:  Right and left ovaries as well as portions of fallopian tubes  for a permanent section only.   ANESTHESIA:  General endotracheal.   FINDINGS:  A well supported vaginal cuff with adhesive disease encountered  primarily the fatty epiploica adherent to vaginal cuff.  A simple appearing  cyst on the right ovary 2 cm in size in addition inferior most portion of  the right ovary adherent to the pelvic sidewall.   DESCRIPTION OF PROCEDURE:  The patient is brought to the operating room,  vital signs are stable, she underwent uncomplicated induction of general  endotracheal anesthesia.  The abdominal area is prepped in the usual manner  utilizing a Betadine soap and scrub. She is then placed in the low lithotomy  position, Foley catheter sterilely placed to straight drainage.  A vaginal  prep is then performed again utilizing Betadine solution.  The patient is  then sterilely prepped and  draped utilizing the sterile draping kit.  The  abdominal wall is again examined noted to be no palpable masses.  No  surgical scars were identified. A 1 cm vertical incision is then made just  inferior to the umbilicus in a vertical manner. The abdominal wall was then  elevated, the Veress needle was then passed through the subumbilical  incision angling towards the hollow of the sacrum, this is done  atraumatically.  A drop test then confirms a proper intraperitoneal  placement and pneumoperitoneum is created by insufflating 3 liters of CO2  gas with a filling pressure of 15 mmHg.  After assurance of  pneumoperitoneum, the Veress needle was removed, the abdominal wall was then  elevated. The 10 mm disposable trocar and sleeve were then inserted through  the subumbilical incision angling towards the hollow of the sacrum, this is  done atraumatically. Trocar is removed, diagnostic scope is inserted which  confirms a proper intraperitoneal placement with no apparent bowel or  vascular injury. With the patient now in Trendelenburg position under direct  visualization, a 5 mm disposable trocar is used suprapubically in the  midline and again under direct visualization. A third 12 mm trocar and  sleeve is then inserted to the left of the umbilicus.  Note the abdominal  wall was transilluminated prior to insertion.  This likewise was done under  direct visualization.  The pelvic contents now describes as previously. The  right ovary is to be removed first.  Initially dissection is required to  free up the inferior pole of the ovary from the right pelvic sidewall. This  is done in the avascular plane. Utilizing blunt dissection and cauterizing  bleeders as needed, minimal cautery is performed.  All surgical dissection  is performed well away from the visualized ureter.  The ovary is then  grasped with an alligator clamp which allows its manipulation. The  infundibulopelvic ligament is identified,  the ovary likewise noted to be  adherent to the vaginal cuff which again requires blunt dissection in the  avascular plane with the dolphin clamp and again cauterizing bleeders as  indicated. After the ovary is completely freed up, there was noted to be a  rupture of the simple cyst 2 cm in size with clear fluid drainage.  The  EndoGIA stapling device is then utilized. First it is placed across the  infundibulopelvic ligament very close to the ovary itself to avoid any  lateral injury to adjacent pelvic sidewall.  The ovary is again then  manipulated and a second firing of the EndoGIA is required perpendicular to  the first.  This primarily securing the fibrous tissue between the ovary  itself and the vaginal cuff.  A minimal portion of the ovary is now still  adherent to the vaginal cuff with this fibrous type tissue. The Endoloop  suture is attempted to be placed around this and the loop is secured but  this results in tearing of this last portion of tissue. Thus the Endoloop is  removed, the pedicle is examined and there is noted to be only a small  amount of active bleeding at the extreme most superior most portion of the  stapling of the infundibulopelvic ligament. This is cauterized with a J hook  cautery.  J hook cauterization is then also performed very superficially  along the remainder of the staple line, this secures hemostasis.  Careful  examination reveals that the entirety of the ovary appears to have been  removed.  With the cyst having incidentally ruptured, it is very easy to  remove this ovary through the 12 mm trocar and handed off for specimen.  Apparently with tearing of the tissue initially, the right ovary and distal  portion of right fallopian tube are separate specimens but placed together.  I then turned attention to the left ovary. The left ovary is likewise noted  to have some filmy adhesions to the vaginal cuff. These are then bluntly lysed with the dolphin  dissector and in some portions unipolar endoshears is  utilized to free up these adhesions resulting in freeing up the ovary.  In  addition some of the epiploica fat of the large colon is adherent to the  left pelvic sidewall. This is then bluntly separated in the avascular plane.  The left ovary is then grasped with the alligator grasping forceps which  allows  identification of the infundibulopelvic ligament which is then  clamped with the EndoGIA stapler.  This completely removes the attachments  of the left ovary. The left ovary is then likewise removed through the 12 mm  port.  Our pedicle is examined, noted to be free with no active bleeding and  no evidence of any injury to the adjacent ureter.  Minimal cauterization had  been performed on the left.  Irrigation is then performed in the pelvic  cavity until clear.  This likewise assure hemostasis and no ball or vascular  injury is noted to be present.  A Surgicel woven clot is then placed over  the staple lines bilaterally to cover any raw peritoneal surfaces.  Irrigation is again performed until clear.  About 100 mL of sterile normal  saline is left within the pelvic cavity. The procedure is then terminated,  all instruments are removed and gas is allowed to escape. The 12 mm trocar  site is closed first. The fascial edges are identified and grasped with  Allis clamps. The fascia is then closed with a pursestring suture of 2-0  Vicryl suture. Additional sutures of 2-0 Vicryl are placed within the  subcutaneous fat to close up some of this dead space.  Subumbilically, a  single 2-0 Vicryl suture is placed through the subcutaneous fat to close up  dead space. The three incision sites were then completely closed utilizing  skin staples. To facilitate postoperative analgesia, a total of 20 mL of  0.5% bupivacaine is injected subcutaneously at the 3 staple sites.  The  procedure is then terminated, patient is reversed of anesthesia and  taken to  the recovery room in stable condition. The operative findings are discussed  with the patient's awaiting husband and she will be referred to the fourth  floor for continued observation status.  Foley continues to drain clear  yellow urine.      ___________________________________________                                            Langley Gauss, M.D.   DC/MEDQ  D:  08/03/2003  T:  08/04/2003  Job:  [id number]

## 2013-12-13 ENCOUNTER — Other Ambulatory Visit (HOSPITAL_COMMUNITY): Payer: Self-pay | Admitting: Family Medicine

## 2013-12-13 ENCOUNTER — Ambulatory Visit (HOSPITAL_COMMUNITY)
Admission: RE | Admit: 2013-12-13 | Discharge: 2013-12-13 | Disposition: A | Discharge status: Home / Self Care | Payer: Medicare Other | Source: Ambulatory Visit | Attending: Family Medicine | Admitting: Family Medicine

## 2013-12-13 DIAGNOSIS — R918 Other nonspecific abnormal finding of lung field: Secondary | ICD-10-CM | POA: Insufficient documentation

## 2013-12-13 DIAGNOSIS — R071 Chest pain on breathing: Secondary | ICD-10-CM | POA: Insufficient documentation

## 2013-12-13 DIAGNOSIS — Z85118 Personal history of other malignant neoplasm of bronchus and lung: Secondary | ICD-10-CM | POA: Insufficient documentation

## 2013-12-13 DIAGNOSIS — J439 Emphysema, unspecified: Secondary | ICD-10-CM | POA: Insufficient documentation

## 2013-12-13 DIAGNOSIS — R0781 Pleurodynia: Secondary | ICD-10-CM

## 2013-12-20 ENCOUNTER — Other Ambulatory Visit (HOSPITAL_COMMUNITY): Payer: Self-pay | Admitting: Family Medicine

## 2013-12-20 DIAGNOSIS — J449 Chronic obstructive pulmonary disease, unspecified: Secondary | ICD-10-CM

## 2013-12-26 ENCOUNTER — Other Ambulatory Visit (HOSPITAL_COMMUNITY): Payer: Self-pay | Admitting: Family Medicine

## 2013-12-26 ENCOUNTER — Ambulatory Visit (HOSPITAL_COMMUNITY)
Admission: RE | Admit: 2013-12-26 | Discharge: 2013-12-26 | Disposition: A | Discharge status: Home / Self Care | Payer: Medicare Other | Source: Ambulatory Visit | Attending: Family Medicine | Admitting: Family Medicine

## 2013-12-26 ENCOUNTER — Encounter (HOSPITAL_COMMUNITY): Payer: Self-pay

## 2013-12-26 DIAGNOSIS — J4489 Other specified chronic obstructive pulmonary disease: Secondary | ICD-10-CM | POA: Insufficient documentation

## 2013-12-26 DIAGNOSIS — R918 Other nonspecific abnormal finding of lung field: Secondary | ICD-10-CM

## 2013-12-26 DIAGNOSIS — R079 Chest pain, unspecified: Secondary | ICD-10-CM | POA: Insufficient documentation

## 2013-12-26 DIAGNOSIS — J438 Other emphysema: Secondary | ICD-10-CM | POA: Insufficient documentation

## 2013-12-26 DIAGNOSIS — J449 Chronic obstructive pulmonary disease, unspecified: Secondary | ICD-10-CM | POA: Insufficient documentation

## 2013-12-26 HISTORY — DX: Malignant neoplasm of unspecified part of unspecified bronchus or lung: C34.90

## 2013-12-26 MED ORDER — IOHEXOL 300 MG/ML  SOLN
80.0000 mL | Freq: Once | INTRAMUSCULAR | Status: AC | PRN
  Administered 2013-12-26: 80 mL via INTRAVENOUS

## 2014-02-08 ENCOUNTER — Other Ambulatory Visit (HOSPITAL_COMMUNITY): Payer: Self-pay | Admitting: Family Medicine

## 2014-02-08 ENCOUNTER — Ambulatory Visit (HOSPITAL_COMMUNITY)
Admission: RE | Admit: 2014-02-08 | Discharge: 2014-02-08 | Disposition: A | Discharge status: Home / Self Care | Payer: Medicare Other | Source: Ambulatory Visit | Attending: Family Medicine | Admitting: Family Medicine

## 2014-02-08 DIAGNOSIS — J189 Pneumonia, unspecified organism: Secondary | ICD-10-CM | POA: Diagnosis not present

## 2014-02-08 DIAGNOSIS — J181 Lobar pneumonia, unspecified organism: Secondary | ICD-10-CM

## 2014-02-09 ENCOUNTER — Ambulatory Visit (HOSPITAL_COMMUNITY)
Admission: RE | Admit: 2014-02-09 | Discharge: 2014-02-09 | Disposition: A | Discharge status: Home / Self Care | Payer: Medicare Other | Source: Ambulatory Visit | Attending: Family Medicine | Admitting: Family Medicine

## 2014-02-09 ENCOUNTER — Other Ambulatory Visit (HOSPITAL_COMMUNITY): Payer: Self-pay | Admitting: Family Medicine

## 2014-02-09 DIAGNOSIS — R911 Solitary pulmonary nodule: Secondary | ICD-10-CM | POA: Diagnosis not present

## 2014-02-09 DIAGNOSIS — Z8541 Personal history of malignant neoplasm of cervix uteri: Secondary | ICD-10-CM | POA: Diagnosis not present

## 2014-02-09 DIAGNOSIS — Z85118 Personal history of other malignant neoplasm of bronchus and lung: Secondary | ICD-10-CM | POA: Insufficient documentation

## 2014-02-09 DIAGNOSIS — R222 Localized swelling, mass and lump, trunk: Secondary | ICD-10-CM | POA: Diagnosis present

## 2014-02-09 MED ORDER — IOHEXOL 300 MG/ML  SOLN
80.0000 mL | Freq: Once | INTRAMUSCULAR | Status: AC | PRN
  Administered 2014-02-09: 80 mL via INTRAVENOUS

## 2014-02-13 ENCOUNTER — Other Ambulatory Visit (HOSPITAL_COMMUNITY): Payer: Self-pay | Admitting: Pulmonary Disease

## 2014-02-13 DIAGNOSIS — R918 Other nonspecific abnormal finding of lung field: Secondary | ICD-10-CM

## 2014-02-13 DIAGNOSIS — Z85118 Personal history of other malignant neoplasm of bronchus and lung: Secondary | ICD-10-CM

## 2014-02-28 ENCOUNTER — Ambulatory Visit (HOSPITAL_COMMUNITY)
Admission: RE | Admit: 2014-02-28 | Discharge: 2014-02-28 | Disposition: A | Discharge status: Home / Self Care | Payer: Medicare Other | Source: Ambulatory Visit | Attending: Pulmonary Disease | Admitting: Pulmonary Disease

## 2014-02-28 DIAGNOSIS — Z85118 Personal history of other malignant neoplasm of bronchus and lung: Secondary | ICD-10-CM | POA: Diagnosis not present

## 2014-02-28 DIAGNOSIS — C773 Secondary and unspecified malignant neoplasm of axilla and upper limb lymph nodes: Secondary | ICD-10-CM | POA: Diagnosis not present

## 2014-02-28 DIAGNOSIS — R222 Localized swelling, mass and lump, trunk: Secondary | ICD-10-CM | POA: Insufficient documentation

## 2014-02-28 DIAGNOSIS — C3490 Malignant neoplasm of unspecified part of unspecified bronchus or lung: Secondary | ICD-10-CM | POA: Insufficient documentation

## 2014-02-28 DIAGNOSIS — R918 Other nonspecific abnormal finding of lung field: Secondary | ICD-10-CM

## 2014-02-28 DIAGNOSIS — J13 Pneumonia due to Streptococcus pneumoniae: Secondary | ICD-10-CM | POA: Insufficient documentation

## 2014-02-28 LAB — GLUCOSE, CAPILLARY: GLUCOSE-CAPILLARY: 119 mg/dL — AB (ref 70–99)

## 2014-02-28 MED ORDER — FLUDEOXYGLUCOSE F - 18 (FDG) INJECTION
5.2000 | Freq: Once | INTRAVENOUS | Status: AC | PRN
  Administered 2014-02-28: 5.2 via INTRAVENOUS

## 2014-03-05 ENCOUNTER — Encounter (HOSPITAL_COMMUNITY): Payer: Self-pay | Admitting: Pharmacy Technician

## 2014-03-05 ENCOUNTER — Other Ambulatory Visit (HOSPITAL_COMMUNITY): Payer: Self-pay | Admitting: Pulmonary Disease

## 2014-03-05 DIAGNOSIS — R590 Localized enlarged lymph nodes: Secondary | ICD-10-CM

## 2014-03-05 DIAGNOSIS — C770 Secondary and unspecified malignant neoplasm of lymph nodes of head, face and neck: Secondary | ICD-10-CM

## 2014-03-06 ENCOUNTER — Other Ambulatory Visit: Payer: Self-pay | Admitting: Radiology

## 2014-03-08 ENCOUNTER — Ambulatory Visit (HOSPITAL_COMMUNITY)
Admission: RE | Admit: 2014-03-08 | Discharge: 2014-03-08 | Disposition: A | Discharge status: Home / Self Care | Payer: Medicare Other | Source: Ambulatory Visit | Attending: Pulmonary Disease | Admitting: Pulmonary Disease

## 2014-03-08 ENCOUNTER — Encounter (HOSPITAL_COMMUNITY): Payer: Self-pay

## 2014-03-08 DIAGNOSIS — F17200 Nicotine dependence, unspecified, uncomplicated: Secondary | ICD-10-CM | POA: Diagnosis not present

## 2014-03-08 DIAGNOSIS — R059 Cough, unspecified: Secondary | ICD-10-CM | POA: Insufficient documentation

## 2014-03-08 DIAGNOSIS — M542 Cervicalgia: Secondary | ICD-10-CM | POA: Insufficient documentation

## 2014-03-08 DIAGNOSIS — R05 Cough: Secondary | ICD-10-CM | POA: Insufficient documentation

## 2014-03-08 DIAGNOSIS — C770 Secondary and unspecified malignant neoplasm of lymph nodes of head, face and neck: Secondary | ICD-10-CM | POA: Insufficient documentation

## 2014-03-08 DIAGNOSIS — R0602 Shortness of breath: Secondary | ICD-10-CM | POA: Diagnosis not present

## 2014-03-08 DIAGNOSIS — Z85118 Personal history of other malignant neoplasm of bronchus and lung: Secondary | ICD-10-CM | POA: Insufficient documentation

## 2014-03-08 DIAGNOSIS — C539 Malignant neoplasm of cervix uteri, unspecified: Secondary | ICD-10-CM | POA: Insufficient documentation

## 2014-03-08 DIAGNOSIS — R079 Chest pain, unspecified: Secondary | ICD-10-CM | POA: Diagnosis not present

## 2014-03-08 DIAGNOSIS — M549 Dorsalgia, unspecified: Secondary | ICD-10-CM | POA: Diagnosis not present

## 2014-03-08 DIAGNOSIS — Z8541 Personal history of malignant neoplasm of cervix uteri: Secondary | ICD-10-CM | POA: Diagnosis not present

## 2014-03-08 HISTORY — DX: Malignant neoplasm of cervix uteri, unspecified: C53.9

## 2014-03-08 LAB — PROTIME-INR
INR: 1.2 (ref 0.00–1.49)
Prothrombin Time: 15.2 seconds (ref 11.6–15.2)

## 2014-03-08 LAB — CBC
HEMATOCRIT: 38.1 % (ref 36.0–46.0)
HEMOGLOBIN: 12 g/dL (ref 12.0–15.0)
MCH: 26.3 pg (ref 26.0–34.0)
MCHC: 31.5 g/dL (ref 30.0–36.0)
MCV: 83.6 fL (ref 78.0–100.0)
Platelets: 395 10*3/uL (ref 150–400)
RBC: 4.56 MIL/uL (ref 3.87–5.11)
RDW: 16.8 % — ABNORMAL HIGH (ref 11.5–15.5)
WBC: 6.1 10*3/uL (ref 4.0–10.5)

## 2014-03-08 LAB — APTT: aPTT: 33 seconds (ref 24–37)

## 2014-03-08 MED ORDER — LIDOCAINE HCL (PF) 1 % IJ SOLN
INTRAMUSCULAR | Status: AC
  Filled 2014-03-08: qty 10

## 2014-03-08 MED ORDER — HYDROCODONE-ACETAMINOPHEN 5-325 MG PO TABS: 1.0000 | ORAL_TABLET | ORAL | Status: DC | PRN

## 2014-03-08 MED ORDER — FENTANYL CITRATE 0.05 MG/ML IJ SOLN
INTRAMUSCULAR | Status: AC | PRN
  Administered 2014-03-08 (×2): 50 ug via INTRAVENOUS

## 2014-03-08 MED ORDER — MIDAZOLAM HCL 2 MG/2ML IJ SOLN
INTRAMUSCULAR | Status: AC
  Filled 2014-03-08: qty 4

## 2014-03-08 MED ORDER — MIDAZOLAM HCL 2 MG/2ML IJ SOLN
INTRAMUSCULAR | Status: AC | PRN
  Administered 2014-03-08 (×2): 1 mg via INTRAVENOUS

## 2014-03-08 MED ORDER — SODIUM CHLORIDE 0.9 % IV SOLN: Freq: Once | INTRAVENOUS | Status: DC

## 2014-03-08 MED ORDER — FENTANYL CITRATE 0.05 MG/ML IJ SOLN
INTRAMUSCULAR | Status: AC
  Filled 2014-03-08: qty 4

## 2014-03-08 NOTE — Sedation Documentation (Signed)
Patient denies pain and is resting comfortably.  

## 2014-03-08 NOTE — Procedures (Signed)
Interventional Radiology Procedure Note  Procedure: US guided FNA and core biopsy Complications: None immediate Recommendations: - Bedrest x 1 hr - Path pending  Signed,  Criselda Peaches, MD Vascular & Interventional Radiologist [facility]

## 2014-03-08 NOTE — Discharge Instructions (Signed)
Wound Care Wound care helps prevent pain and infection.  HOME CARE   Only take medicine as told by your doctor.  Clean the wound daily with mild soap and water.  Change any bandages (dressings) as told by your doctor.  Take showers. Do not take baths, swim, or do anything that puts your wound under water. GET HELP RIGHT AWAY IF:   Yellowish-white fluid (pus) comes from the wound.  Medicine does not lessen your pain.  There is a red streak going away from the wound.  You have a fever. MAKE SURE YOU:   Understand these instructions.  Will watch your condition.  Will get help right away if you are not doing well or get worse. Document Released: 04/07/2008 Document Revised: 09/21/2011 Document Reviewed: 11/02/2010 [facility] Patient Information 2015 [place], [place]. This information is not intended to replace advice given to you by your health care provider. Make sure you discuss any questions you have with your health care provider.

## 2014-03-08 NOTE — H&P (Signed)
[name] is an 43 y.o. female.   Chief Complaint: Pt has hx of cervical lung cancer Rt lung partial removal 2010 Was seen by PMD recently for cough and chest/left shoulder pain Treated for "lung infection" Continued with symptoms of cough Was referred to [facility] MD CT reveals B lung nodules; L supraclavicular lymph node enlargement; L suprahilar mass +PET 02/28/2014 Now scheduled for L [place] LN biopsy  HPI: lung Ca; cervical ca  Past Medical History  Diagnosis Date  . Lung cancer 2010    pt states surgery only     Past Surgical History  Procedure Laterality Date  . Lung cancer surgery Right 2010    History reviewed. No pertinent family history. Social History:  reports that she has been smoking.  She does not have any smokeless tobacco history on file. Her alcohol and drug histories are not on file.  Allergies: No Known Allergies   (Not in a hospital admission)  No results found for this or any previous visit (from the past 48 hour(s)). No results found.  Review of Systems  Constitutional: Negative for fever and weight loss.  Respiratory: Positive for cough, sputum production and shortness of breath.   Cardiovascular: Positive for chest pain.  Gastrointestinal: Negative for nausea, vomiting and abdominal pain.  Musculoskeletal: Positive for back pain and neck pain.  Neurological: Negative for dizziness, weakness and headaches.  Psychiatric/Behavioral: Positive for substance abuse.       Smoker    Blood pressure 112/65, pulse 100, temperature 97.9 F (36.6 C), temperature source Oral, resp. rate 18, height 5\' 2"  (1.575 m), weight 46.267 kg (102 lb), SpO2 100.00%. Physical Exam  Constitutional: She is oriented to person, place, and time. She appears well-developed and well-nourished.  Cardiovascular: Normal rate, regular rhythm and normal heart sounds.   No murmur heard. Respiratory: Effort normal and breath sounds normal. She has no wheezes.  GI: Soft. Bowel sounds are  normal. There is no tenderness.  Musculoskeletal: Normal range of motion.  Neurological: She is alert and oriented to person, place, and time.  Skin: Skin is warm and dry.  Psychiatric: She has a normal mood and affect. Her behavior is normal. Judgment and thought content normal.     Assessment/Plan Hx Cervical ca; and Lung ca Rt partial lung removal 2010 New B lung nodules; L SCLN and L suprahilar mass +PET Scheduled now for bx of L SCLN Pt aware of procedure benefits and risks and agreeable to proceed Consent signed and in chart  [place] A 03/08/2014, 9:38 AM

## 2014-03-16 ENCOUNTER — Encounter: Payer: Self-pay | Admitting: *Deleted

## 2014-03-16 NOTE — Progress Notes (Signed)
Called and left vm message to call me with Dr. Kathaleen Grinder office following up from thoracic cancer conference.

## 2014-03-29 DIAGNOSIS — C3490 Malignant neoplasm of unspecified part of unspecified bronchus or lung: Secondary | ICD-10-CM | POA: Insufficient documentation

## 2014-04-13 ENCOUNTER — Encounter: Payer: Self-pay | Admitting: *Deleted

## 2014-04-13 NOTE — [facility] Oncology Navigator Note (Unsigned)
Dr. Kathaleen Grinder office called and stated patient is receiving treatment for cancer.

## 2019-05-03 ENCOUNTER — Other Ambulatory Visit: Payer: Self-pay

## 2019-05-03 DIAGNOSIS — Z20822 Contact with and (suspected) exposure to covid-19: Secondary | ICD-10-CM

## 2019-05-04 LAB — NOVEL CORONAVIRUS, NAA: SARS-CoV-2, NAA: NOT DETECTED

## 2019-05-08 ENCOUNTER — Telehealth: Payer: Self-pay | Admitting: General Practice

## 2019-05-08 NOTE — Telephone Encounter (Signed)
Negative COVID results given. Patient results "NOT Detected." Caller expressed understanding. ° °

## 2020-08-20 ENCOUNTER — Other Ambulatory Visit (HOSPITAL_COMMUNITY): Payer: Self-pay | Admitting: Family Medicine

## 2020-08-20 DIAGNOSIS — R0600 Dyspnea, unspecified: Secondary | ICD-10-CM

## 2020-08-21 ENCOUNTER — Other Ambulatory Visit: Payer: Self-pay

## 2020-08-21 ENCOUNTER — Ambulatory Visit (HOSPITAL_COMMUNITY)
Admission: RE | Admit: 2020-08-21 | Discharge: 2020-08-21 | Disposition: A | Discharge status: Home / Self Care | Payer: Medicare Other | Source: Ambulatory Visit | Attending: Family Medicine | Admitting: Family Medicine

## 2020-08-21 DIAGNOSIS — R0600 Dyspnea, unspecified: Secondary | ICD-10-CM | POA: Insufficient documentation

## 2020-08-23 ENCOUNTER — Other Ambulatory Visit (HOSPITAL_COMMUNITY): Payer: Self-pay | Admitting: Family Medicine

## 2020-08-23 DIAGNOSIS — R079 Chest pain, unspecified: Secondary | ICD-10-CM

## 2020-08-23 DIAGNOSIS — Z85118 Personal history of other malignant neoplasm of bronchus and lung: Secondary | ICD-10-CM

## 2020-08-27 ENCOUNTER — Ambulatory Visit (HOSPITAL_COMMUNITY)
Admission: RE | Admit: 2020-08-27 | Discharge: 2020-08-27 | Disposition: A | Discharge status: Home / Self Care | Payer: Medicare Other | Source: Ambulatory Visit | Attending: Family Medicine | Admitting: Family Medicine

## 2020-08-27 ENCOUNTER — Other Ambulatory Visit: Payer: Self-pay

## 2020-08-27 ENCOUNTER — Encounter (HOSPITAL_COMMUNITY): Payer: Self-pay

## 2020-08-27 DIAGNOSIS — Z85118 Personal history of other malignant neoplasm of bronchus and lung: Secondary | ICD-10-CM | POA: Diagnosis present

## 2020-08-27 DIAGNOSIS — R079 Chest pain, unspecified: Secondary | ICD-10-CM | POA: Diagnosis not present

## 2020-08-27 MED ORDER — IOHEXOL 300 MG/ML  SOLN
75.0000 mL | Freq: Once | INTRAMUSCULAR | Status: AC | PRN
  Administered 2020-08-27: 75 mL via INTRAVENOUS

## 2020-09-20 DIAGNOSIS — J432 Centrilobular emphysema: Secondary | ICD-10-CM | POA: Diagnosis not present

## 2020-09-20 DIAGNOSIS — M25512 Pain in left shoulder: Secondary | ICD-10-CM | POA: Diagnosis not present

## 2020-09-20 DIAGNOSIS — Z720 Tobacco use: Secondary | ICD-10-CM | POA: Diagnosis not present

## 2020-09-20 DIAGNOSIS — N898 Other specified noninflammatory disorders of vagina: Secondary | ICD-10-CM | POA: Diagnosis not present

## 2020-11-13 DIAGNOSIS — J439 Emphysema, unspecified: Secondary | ICD-10-CM | POA: Diagnosis not present

## 2020-11-13 DIAGNOSIS — F900 Attention-deficit hyperactivity disorder, predominantly inattentive type: Secondary | ICD-10-CM | POA: Diagnosis not present

## 2020-11-13 DIAGNOSIS — F17210 Nicotine dependence, cigarettes, uncomplicated: Secondary | ICD-10-CM | POA: Diagnosis not present

## 2020-11-13 DIAGNOSIS — N951 Menopausal and female climacteric states: Secondary | ICD-10-CM | POA: Diagnosis not present

## 2020-12-25 ENCOUNTER — Other Ambulatory Visit (HOSPITAL_COMMUNITY): Payer: Self-pay | Admitting: Family Medicine

## 2020-12-25 DIAGNOSIS — M545 Low back pain, unspecified: Secondary | ICD-10-CM

## 2021-01-17 ENCOUNTER — Other Ambulatory Visit: Payer: Self-pay

## 2021-01-17 ENCOUNTER — Other Ambulatory Visit (HOSPITAL_COMMUNITY): Payer: Self-pay | Admitting: Family Medicine

## 2021-01-17 ENCOUNTER — Ambulatory Visit (HOSPITAL_COMMUNITY)
Admission: RE | Admit: 2021-01-17 | Discharge: 2021-01-17 | Disposition: A | Discharge status: Home / Self Care | Payer: Medicare HMO | Source: Ambulatory Visit | Attending: Family Medicine | Admitting: Family Medicine

## 2021-01-17 DIAGNOSIS — M545 Low back pain, unspecified: Secondary | ICD-10-CM | POA: Insufficient documentation

## 2021-01-21 ENCOUNTER — Other Ambulatory Visit (HOSPITAL_COMMUNITY): Payer: Self-pay | Admitting: Family Medicine

## 2021-01-21 ENCOUNTER — Other Ambulatory Visit: Payer: Self-pay | Admitting: Family Medicine

## 2021-01-21 DIAGNOSIS — G629 Polyneuropathy, unspecified: Secondary | ICD-10-CM

## 2021-01-31 ENCOUNTER — Ambulatory Visit (HOSPITAL_COMMUNITY)
Admission: RE | Admit: 2021-01-31 | Discharge: 2021-01-31 | Disposition: A | Discharge status: Home / Self Care | Payer: Medicare HMO | Source: Ambulatory Visit | Attending: Family Medicine | Admitting: Family Medicine

## 2021-01-31 ENCOUNTER — Other Ambulatory Visit: Payer: Self-pay

## 2021-01-31 DIAGNOSIS — G629 Polyneuropathy, unspecified: Secondary | ICD-10-CM | POA: Diagnosis present

## 2021-02-28 ENCOUNTER — Other Ambulatory Visit: Payer: Self-pay | Admitting: Nurse Practitioner

## 2021-02-28 DIAGNOSIS — G8929 Other chronic pain: Secondary | ICD-10-CM

## 2021-02-28 DIAGNOSIS — M545 Low back pain, unspecified: Secondary | ICD-10-CM

## 2021-03-15 ENCOUNTER — Encounter (HOSPITAL_COMMUNITY): Payer: Self-pay

## 2021-03-15 ENCOUNTER — Emergency Department (HOSPITAL_COMMUNITY): Payer: Medicare HMO

## 2021-03-15 ENCOUNTER — Emergency Department (HOSPITAL_COMMUNITY)
Admission: EM | Admit: 2021-03-15 | Discharge: 2021-03-15 | Disposition: A | Discharge status: Home / Self Care | Payer: Medicare HMO | Attending: Emergency Medicine | Admitting: Emergency Medicine

## 2021-03-15 ENCOUNTER — Other Ambulatory Visit: Payer: Self-pay

## 2021-03-15 DIAGNOSIS — R000 Tachycardia, unspecified: Secondary | ICD-10-CM | POA: Insufficient documentation

## 2021-03-15 DIAGNOSIS — D649 Anemia, unspecified: Secondary | ICD-10-CM | POA: Diagnosis not present

## 2021-03-15 DIAGNOSIS — F17200 Nicotine dependence, unspecified, uncomplicated: Secondary | ICD-10-CM | POA: Insufficient documentation

## 2021-03-15 DIAGNOSIS — Z85118 Personal history of other malignant neoplasm of bronchus and lung: Secondary | ICD-10-CM | POA: Insufficient documentation

## 2021-03-15 DIAGNOSIS — R0789 Other chest pain: Secondary | ICD-10-CM | POA: Diagnosis not present

## 2021-03-15 DIAGNOSIS — E876 Hypokalemia: Secondary | ICD-10-CM | POA: Insufficient documentation

## 2021-03-15 DIAGNOSIS — Z8541 Personal history of malignant neoplasm of cervix uteri: Secondary | ICD-10-CM | POA: Insufficient documentation

## 2021-03-15 DIAGNOSIS — R079 Chest pain, unspecified: Secondary | ICD-10-CM | POA: Diagnosis present

## 2021-03-15 LAB — TROPONIN I (HIGH SENSITIVITY)
Troponin I (High Sensitivity): <2 ng/L (ref <18)
Troponin I (High Sensitivity): 3 ng/L (ref <18)

## 2021-03-15 LAB — CBC
HCT: 36.2 % (ref 36.0–46.0)
Hemoglobin: 11.4 g/dL — ABNORMAL LOW (ref 12.0–15.0)
MCH: 29.5 pg (ref 26.0–34.0)
MCHC: 31.5 g/dL (ref 30.0–36.0)
MCV: 93.8 fL (ref 80.0–100.0)
Platelets: 350 10*3/uL (ref 150–400)
RBC: 3.86 MIL/uL — ABNORMAL LOW (ref 3.87–5.11)
RDW: 14.3 % (ref 11.5–15.5)
WBC: 10 10*3/uL (ref 4.0–10.5)
nRBC: 0 % (ref 0.0–0.2)

## 2021-03-15 LAB — BASIC METABOLIC PANEL
Anion gap: 6 (ref 5–15)
BUN: 13 mg/dL (ref 6–20)
CO2: 30 mmol/L (ref 22–32)
Calcium: 9 mg/dL (ref 8.9–10.3)
Chloride: 105 mmol/L (ref 98–111)
Creatinine, Ser: 0.5 mg/dL (ref 0.44–1.00)
GFR, Estimated: >60 mL/min (ref >60)
Glucose, Bld: 116 mg/dL — ABNORMAL HIGH (ref 70–99)
Potassium: 3.4 mmol/L — ABNORMAL LOW (ref 3.5–5.1)
Sodium: 141 mmol/L (ref 135–145)

## 2021-03-15 MED ORDER — IOHEXOL 350 MG/ML SOLN
75.0000 mL | Freq: Once | INTRAVENOUS | Status: AC | PRN
  Administered 2021-03-15: 75 mL via INTRAVENOUS

## 2021-03-15 NOTE — ED Triage Notes (Addendum)
Rcems from home with cc of chest pain a little after 5am. That is worse with inhalation/exhalation.  Went to pcp yesterday but wasn't hurting then.  Hx of lung cancer.  No blood thinners 20r ac by ems

## 2021-03-15 NOTE — Discharge Instructions (Addendum)
Work-up for the chest pain without any acute findings.  Return for chest pain recurrence lasting 20 minutes or longer.  Make an appointment to follow-up with your primary care doctor.

## 2021-03-15 NOTE — ED Notes (Signed)
EKG done and given to Dr Wyvonnia Dusky

## 2021-03-15 NOTE — ED Provider Notes (Signed)
[facility] EMERGENCY DEPARTMENT Provider Note   CSN: [id number] Arrival date & time: 03/15/21  0633     History Chief Complaint  Patient presents with   Chest Pain    [name] is a 50 y.o. female.  Patient woke this morning at about 5 AM with fairly significant chest pain.  Somewhat all over the chest.  But did radiate to the back.  Starting to improve some now.  Patient denies any leg swelling.  Patient had a history of lung cancer that was treated surgically only in 2010.  Patient does not have a history of chest pain like this.  Denies any shortness of breath nausea or vomiting.  Patient brought in by EMS.      Past Medical History:  Diagnosis Date   Cervical cancer ([place])    Lung cancer ([place]) 2010   pt states surgery only     Patient Active Problem List   Diagnosis Date Noted   Cervical cancer [facility])     Past Surgical History:  Procedure Laterality Date   LUNG CANCER SURGERY Right 2010     OB History   No obstetric history on file.     History reviewed. No pertinent family history.  Social History   Tobacco Use   Smoking status: Every Day    Home Medications Prior to Admission medications   Medication Sig Start Date End Date Taking? Authorizing Provider  ALPRAZolam Duanne Moron) 1 MG tablet Take by mouth. 02/18/21   [provider]  amphetamine-dextroamphetamine (ADDERALL) 30 MG tablet Take 30 mg by mouth daily.    [provider]  buPROPion (WELLBUTRIN SR) 150 MG 12 hr tablet Take by mouth. 09/20/20   [provider]  estradiol (ESTRACE) 0.1 MG/GM vaginal cream Place vaginally. 03/13/21   [provider]  gabapentin (NEURONTIN) 800 MG tablet SMARTSIG:1 Tablet(s) By Mouth 1-4 Times Daily 02/28/21   [provider]  predniSONE (DELTASONE) 10 MG tablet Take by mouth. 02/28/21   [provider]  varenicline (CHANTIX) 1 MG tablet Take 0.5 mg by mouth 2 (two) times daily. 09/20/20   [provider]     Allergies    Patient has no known allergies.  Review of Systems   Review of Systems  Constitutional:  Negative for chills and fever.  HENT:  Negative for ear pain and sore throat.   Eyes:  Negative for pain and visual disturbance.  Respiratory:  Negative for cough and shortness of breath.   Cardiovascular:  Positive for chest pain. Negative for palpitations and leg swelling.  Gastrointestinal:  Negative for abdominal pain and vomiting.  Genitourinary:  Negative for dysuria and hematuria.  Musculoskeletal:  Negative for arthralgias and back pain.  Skin:  Negative for color change and rash.  Neurological:  Negative for seizures and syncope.  All other systems reviewed and are negative.  Physical Exam Updated Vital Signs BP (!) 109/59   Pulse 88   Temp 98.4 F (36.9 C) (Oral)   Resp 20   Ht 1.575 m (5\' 2" )   Wt 43.8 kg   SpO2 95%   BMI 17.67 kg/m   Physical Exam Vitals and nursing note reviewed.  Constitutional:      General: She is not in acute distress.    Appearance: Normal appearance. She is well-developed.  HENT:     Head: Normocephalic and atraumatic.  Eyes:     Extraocular Movements: Extraocular movements intact.     Conjunctiva/sclera: Conjunctivae normal.  Pupils: Pupils are equal, round, and reactive to light.  Cardiovascular:     Rate and Rhythm: Normal rate and regular rhythm.     Heart sounds: No murmur heard. Pulmonary:     Effort: Pulmonary effort is normal. No respiratory distress.     Breath sounds: Normal breath sounds. No wheezing or rales.  Chest:     Chest wall: No tenderness.  Abdominal:     Palpations: Abdomen is soft.     Tenderness: There is no abdominal tenderness.  Musculoskeletal:        General: Normal range of motion.     Cervical back: Normal range of motion and neck supple.  Skin:    General: Skin is warm and dry.  Neurological:     General: No focal deficit present.     Mental Status: She is alert and oriented to  person, place, and time.    ED Results / Procedures / Treatments   Labs (all labs ordered are listed, but only abnormal results are displayed) Labs Reviewed  BASIC METABOLIC PANEL - Abnormal; Notable for the following components:      Result Value   Potassium 3.4 (*)    Glucose, Bld 116 (*)    All other components within normal limits  CBC - Abnormal; Notable for the following components:   RBC 3.86 (*)    Hemoglobin 11.4 (*)    All other components within normal limits  TROPONIN I (HIGH SENSITIVITY)  TROPONIN I (HIGH SENSITIVITY)    EKG EKG Interpretation  Date/Time:  Saturday March 15 2021 06:40:06 EDT Ventricular Rate:  105 PR Interval:  137 QRS Duration: 88 QT Interval:  366 QTC Calculation: 484 R Axis:   89 Text Interpretation: Sinus tachycardia with irregular rate T wave inversion v3 is new No significant change was found Confirmed by Ezequiel Essex [phone]) on 03/15/2021 6:46:37 AM  Radiology DG Chest 2 View  Result Date: 03/15/2021 CLINICAL DATA:  Chest pain EXAM: CHEST - 2 VIEW COMPARISON:  08/27/2020 FINDINGS: Hyperinflation and apical lucency with interstitial coarsening. Postoperative right lower lung. Chronic asymmetric left pleural based thickening. Artifact from EKG leads. Normal heart size and mediastinal contours. There is no edema, consolidation, effusion, or pneumothorax. IMPRESSION: COPD and prior right lung surgery.  No acute superimposed finding. Electronically Signed   By: Monte Fantasia M.D.   On: 03/15/2021 07:09   CT Angio Chest PE W/Cm &/Or Wo Cm  Result Date: 03/15/2021 CLINICAL DATA:  Patient with chest pain. History of right-sided lung cancer. EXAM: CT ANGIOGRAPHY CHEST WITH CONTRAST TECHNIQUE: Multidetector CT imaging of the chest was performed using the standard protocol during bolus administration of intravenous contrast. Multiplanar CT image reconstructions and MIPs were obtained to evaluate the vascular anatomy. CONTRAST:  40mL OMNIPAQUE  IOHEXOL 350 MG/ML SOLN COMPARISON:  CT chest 08/27/2020. FINDINGS: Cardiovascular: Normal heart size. Aorta and main pulmonary artery normal in caliber. No pericardial effusion. Motion artifact limits evaluation for pulmonary embolus. No intraluminal filling defect identified to suggest acute pulmonary embolus. Mediastinum/Nodes: No enlarged axillary, mediastinal or hilar lymphadenopathy. Normal appearance of the esophagus. Lungs/Pleura: Central airways are patent. Bibasilar atelectasis. Emphysematous changes. Similar-appearing left lung apex scarring. Previously measured 9 mm cavitary component is grossly unchanged (image 18; series 7). Stable 3 mm left lower lobe nodule (image 82; series 7). Stable 6 mm right lower lobe nodule (image 96; series 7). Similar bandlike scarring within the right upper lobe. Stable postsurgical changes right hemithorax compatible with right middle lobe resection. No  pleural effusion or pneumothorax. Upper Abdomen: Small left renal cyst.  No acute process. Musculoskeletal: No aggressive or acute appearing osseous lesions. Review of the MIP images confirms the above findings. IMPRESSION: 1. No CT evidence for acute pulmonary embolus. 2. Similar-appearing cavitary airspace disease within the left lung apex with small cavitary component, potentially treated lung cancer or resolved pneumonia. 3. Stable small pulmonary nodules. 4. Emphysema. Electronically Signed   By: Lovey Newcomer M.D.   On: 03/15/2021 08:53    Procedures Procedures   Medications Ordered in ED Medications  iohexol (OMNIPAQUE) 350 MG/ML injection 75 mL (75 mLs Intravenous Contrast Given 03/15/21 0756)    ED Course  I have reviewed the triage vital signs and the nursing notes.  Pertinent labs & imaging results that were available during my care of the patient were reviewed by me and considered in my medical decision making (see chart for details).    MDM Rules/Calculators/A&P                           Chest  pain is now resolved.  Patient's troponins x2 without any acute elevation.  Patient's basic metabolic panel only significant for some mild hypokalemia with a potassium of 3.4.  CBC significant for no leukocytosis.  Mild anemia hemoglobin 11.4.  Platelets normal.  Chest x-ray without acute findings.  But since she had a history of the lung cancer.  CT angio chest was done.  No evidence of pulmonary embolism.  There is some evidence of some old cavitary changes but nothing acute.  Since patient is now pain-free feel that patient is safe for follow-up as an outpatient will return for any recurrent chest pain.   Final Clinical Impression(s) / ED Diagnoses Final diagnoses:  Atypical chest pain    Rx / DC Orders ED Discharge Orders     None        Fredia Sorrow, MD 03/15/21 1000

## 2021-03-18 ENCOUNTER — Other Ambulatory Visit (HOSPITAL_COMMUNITY): Payer: Self-pay | Admitting: Neurosurgery

## 2021-03-18 ENCOUNTER — Other Ambulatory Visit: Payer: Self-pay | Admitting: Neurosurgery

## 2021-03-18 DIAGNOSIS — R200 Anesthesia of skin: Secondary | ICD-10-CM

## 2021-03-19 ENCOUNTER — Inpatient Hospital Stay: Admission: RE | Admit: 2021-03-19 | Payer: Medicare HMO | Source: Ambulatory Visit

## 2021-03-27 ENCOUNTER — Other Ambulatory Visit: Payer: Self-pay

## 2021-03-27 ENCOUNTER — Ambulatory Visit (HOSPITAL_COMMUNITY)
Admission: RE | Admit: 2021-03-27 | Discharge: 2021-03-27 | Disposition: A | Discharge status: Home / Self Care | Payer: Medicare HMO | Source: Ambulatory Visit | Attending: Neurosurgery | Admitting: Neurosurgery

## 2021-03-27 DIAGNOSIS — R200 Anesthesia of skin: Secondary | ICD-10-CM | POA: Insufficient documentation

## 2021-04-08 ENCOUNTER — Other Ambulatory Visit (HOSPITAL_COMMUNITY): Payer: Self-pay | Admitting: Neurology

## 2021-04-08 ENCOUNTER — Other Ambulatory Visit: Payer: Self-pay | Admitting: Neurology

## 2021-04-08 DIAGNOSIS — R202 Paresthesia of skin: Secondary | ICD-10-CM

## 2021-04-23 ENCOUNTER — Ambulatory Visit (HOSPITAL_COMMUNITY)
Admission: RE | Admit: 2021-04-23 | Discharge: 2021-04-23 | Disposition: A | Discharge status: Home / Self Care | Payer: Medicare HMO | Source: Ambulatory Visit | Attending: Neurology | Admitting: Neurology

## 2021-04-23 ENCOUNTER — Other Ambulatory Visit: Payer: Self-pay

## 2021-04-23 DIAGNOSIS — R202 Paresthesia of skin: Secondary | ICD-10-CM

## 2021-08-06 ENCOUNTER — Other Ambulatory Visit (HOSPITAL_COMMUNITY): Payer: Self-pay

## 2021-08-06 MED ORDER — AMPHETAMINE-DEXTROAMPHETAMINE 20 MG PO TABS
20.0000 mg | ORAL_TABLET | Freq: Three times a day (TID) | ORAL | 0 refills | Status: DC
  Filled 2021-08-06: qty 90, 23d supply, fill #0
  Filled 2021-08-07 – 2021-08-14 (×3): qty 90, 30d supply, fill #0

## 2021-08-07 ENCOUNTER — Other Ambulatory Visit (HOSPITAL_COMMUNITY): Payer: Self-pay

## 2021-08-13 ENCOUNTER — Other Ambulatory Visit (HOSPITAL_COMMUNITY): Payer: Self-pay

## 2021-08-13 MED ORDER — AMPHETAMINE-DEXTROAMPHETAMINE 20 MG PO TABS: ORAL_TABLET | ORAL | 0 refills | Status: DC

## 2021-08-14 ENCOUNTER — Other Ambulatory Visit (HOSPITAL_COMMUNITY): Payer: Self-pay

## 2021-09-19 ENCOUNTER — Other Ambulatory Visit (HOSPITAL_COMMUNITY): Payer: Self-pay

## 2021-09-19 MED ORDER — AMPHETAMINE-DEXTROAMPHETAMINE 20 MG PO TABS
20.0000 mg | ORAL_TABLET | Freq: Three times a day (TID) | ORAL | 0 refills | Status: DC
  Filled 2021-09-19: qty 90, 30d supply, fill #0

## 2022-06-29 ENCOUNTER — Other Ambulatory Visit (HOSPITAL_COMMUNITY): Payer: Self-pay | Admitting: Nurse Practitioner

## 2022-06-29 DIAGNOSIS — M79604 Pain in right leg: Secondary | ICD-10-CM

## 2022-06-29 DIAGNOSIS — M79605 Pain in left leg: Secondary | ICD-10-CM

## 2022-07-12 ENCOUNTER — Encounter (HOSPITAL_COMMUNITY): Payer: Self-pay | Admitting: *Deleted

## 2022-07-12 ENCOUNTER — Other Ambulatory Visit: Payer: Self-pay

## 2022-07-12 ENCOUNTER — Emergency Department (HOSPITAL_COMMUNITY): Payer: Medicare HMO

## 2022-07-12 ENCOUNTER — Inpatient Hospital Stay (HOSPITAL_COMMUNITY)
Admission: EM | Admit: 2022-07-12 | Discharge: 2022-07-14 | DRG: 190 | Disposition: A | Discharge status: Home / Self Care | Payer: Medicare HMO | Attending: Internal Medicine | Admitting: Internal Medicine

## 2022-07-12 DIAGNOSIS — F1120 Opioid dependence, uncomplicated: Secondary | ICD-10-CM | POA: Insufficient documentation

## 2022-07-12 DIAGNOSIS — J441 Chronic obstructive pulmonary disease with (acute) exacerbation: Secondary | ICD-10-CM | POA: Insufficient documentation

## 2022-07-12 DIAGNOSIS — J9601 Acute respiratory failure with hypoxia: Secondary | ICD-10-CM | POA: Diagnosis not present

## 2022-07-12 DIAGNOSIS — R0902 Hypoxemia: Secondary | ICD-10-CM

## 2022-07-12 DIAGNOSIS — Z7952 Long term (current) use of systemic steroids: Secondary | ICD-10-CM

## 2022-07-12 DIAGNOSIS — F17210 Nicotine dependence, cigarettes, uncomplicated: Secondary | ICD-10-CM | POA: Diagnosis present

## 2022-07-12 DIAGNOSIS — I10 Essential (primary) hypertension: Secondary | ICD-10-CM | POA: Diagnosis present

## 2022-07-12 DIAGNOSIS — R0602 Shortness of breath: Secondary | ICD-10-CM | POA: Diagnosis not present

## 2022-07-12 DIAGNOSIS — F419 Anxiety disorder, unspecified: Secondary | ICD-10-CM | POA: Diagnosis present

## 2022-07-12 DIAGNOSIS — J101 Influenza due to other identified influenza virus with other respiratory manifestations: Secondary | ICD-10-CM | POA: Diagnosis present

## 2022-07-12 DIAGNOSIS — Z85118 Personal history of other malignant neoplasm of bronchus and lung: Secondary | ICD-10-CM

## 2022-07-12 DIAGNOSIS — Z902 Acquired absence of lung [part of]: Secondary | ICD-10-CM

## 2022-07-12 DIAGNOSIS — Z86711 Personal history of pulmonary embolism: Secondary | ICD-10-CM

## 2022-07-12 DIAGNOSIS — J9811 Atelectasis: Secondary | ICD-10-CM | POA: Diagnosis present

## 2022-07-12 DIAGNOSIS — Z90710 Acquired absence of both cervix and uterus: Secondary | ICD-10-CM

## 2022-07-12 DIAGNOSIS — Z8541 Personal history of malignant neoplasm of cervix uteri: Secondary | ICD-10-CM

## 2022-07-12 DIAGNOSIS — Z1152 Encounter for screening for COVID-19: Secondary | ICD-10-CM

## 2022-07-12 HISTORY — DX: Essential (primary) hypertension: I10

## 2022-07-12 HISTORY — DX: Chronic obstructive pulmonary disease, unspecified: J44.9

## 2022-07-12 HISTORY — DX: Malignant (primary) neoplasm, unspecified: C80.1

## 2022-07-12 LAB — RESPIRATORY PANEL BY PCR

## 2022-07-12 LAB — COMPREHENSIVE METABOLIC PANEL
ALT: 37 U/L (ref 0–44)
AST: 21 U/L (ref 15–41)
Albumin: 3.4 g/dL — ABNORMAL LOW (ref 3.5–5.0)
Alkaline Phosphatase: 87 U/L (ref 38–126)
Anion gap: 10 (ref 5–15)
BUN: 15 mg/dL (ref 6–20)
CO2: 28 mmol/L (ref 22–32)
Calcium: 9.2 mg/dL (ref 8.9–10.3)
Chloride: 100 mmol/L (ref 98–111)
Creatinine, Ser: 0.45 mg/dL (ref 0.44–1.00)
GFR, Estimated: >60 mL/min (ref >60)
Glucose, Bld: 101 mg/dL — ABNORMAL HIGH (ref 70–99)
Potassium: 3.7 mmol/L (ref 3.5–5.1)
Sodium: 138 mmol/L (ref 135–145)
Total Bilirubin: 0.7 mg/dL (ref 0.3–1.2)
Total Protein: 8.1 g/dL (ref 6.5–8.1)

## 2022-07-12 LAB — CBC
HCT: 39.1 % (ref 36.0–46.0)
Hemoglobin: 12.5 g/dL (ref 12.0–15.0)
MCH: 28.7 pg (ref 26.0–34.0)
MCHC: 32 g/dL (ref 30.0–36.0)
MCV: 89.7 fL (ref 80.0–100.0)
Platelets: 465 10*3/uL — ABNORMAL HIGH (ref 150–400)
RBC: 4.36 MIL/uL (ref 3.87–5.11)
RDW: 13.7 % (ref 11.5–15.5)
WBC: 13 10*3/uL — ABNORMAL HIGH (ref 4.0–10.5)
nRBC: 0 % (ref 0.0–0.2)

## 2022-07-12 LAB — TROPONIN I (HIGH SENSITIVITY)
Troponin I (High Sensitivity): 3 ng/L (ref <18)
Troponin I (High Sensitivity): 3 ng/L (ref <18)

## 2022-07-12 LAB — RESP PANEL BY RT-PCR (RSV, FLU A&B, COVID)  RVPGX2
Influenza A by PCR: NEGATIVE
Influenza B by PCR: NEGATIVE
Resp Syncytial Virus by PCR: NEGATIVE
SARS Coronavirus 2 by RT PCR: NEGATIVE

## 2022-07-12 LAB — D-DIMER, QUANTITATIVE: D-Dimer, Quant: 0.41 ug/mL-FEU (ref 0.00–0.50)

## 2022-07-12 MED ORDER — IPRATROPIUM-ALBUTEROL 0.5-2.5 (3) MG/3ML IN SOLN
3.0000 mL | RESPIRATORY_TRACT | Status: AC
  Administered 2022-07-12 (×2): 3 mL via RESPIRATORY_TRACT
  Filled 2022-07-12: qty 6

## 2022-07-12 MED ORDER — GABAPENTIN 400 MG PO CAPS
800.0000 mg | ORAL_CAPSULE | Freq: Three times a day (TID) | ORAL | Status: DC
  Administered 2022-07-12 – 2022-07-14 (×5): 800 mg via ORAL
  Filled 2022-07-12 (×5): qty 2

## 2022-07-12 MED ORDER — DULOXETINE HCL 60 MG PO CPEP
60.0000 mg | ORAL_CAPSULE | Freq: Every day | ORAL | Status: DC
  Administered 2022-07-13 – 2022-07-14 (×2): 60 mg via ORAL
  Filled 2022-07-12 (×2): qty 1

## 2022-07-12 MED ORDER — ALPRAZOLAM 1 MG PO TABS
1.0000 mg | ORAL_TABLET | Freq: Three times a day (TID) | ORAL | Status: DC | PRN
  Administered 2022-07-13 – 2022-07-14 (×3): 1 mg via ORAL
  Filled 2022-07-12 (×3): qty 1

## 2022-07-12 MED ORDER — ENOXAPARIN SODIUM 40 MG/0.4ML IJ SOSY
40.0000 mg | PREFILLED_SYRINGE | INTRAMUSCULAR | Status: DC
  Administered 2022-07-12 – 2022-07-13 (×2): 40 mg via SUBCUTANEOUS
  Filled 2022-07-12 (×2): qty 0.4

## 2022-07-12 MED ORDER — HYDROCODONE-ACETAMINOPHEN 10-325 MG PO TABS: 1.0000 | ORAL_TABLET | Freq: Four times a day (QID) | ORAL | Status: DC | PRN

## 2022-07-12 MED ORDER — ACETAMINOPHEN 650 MG RE SUPP: 650.0000 mg | Freq: Four times a day (QID) | RECTAL | Status: DC | PRN

## 2022-07-12 MED ORDER — IPRATROPIUM-ALBUTEROL 0.5-2.5 (3) MG/3ML IN SOLN
3.0000 mL | Freq: Four times a day (QID) | RESPIRATORY_TRACT | Status: DC
  Administered 2022-07-12 – 2022-07-14 (×6): 3 mL via RESPIRATORY_TRACT
  Filled 2022-07-12 (×6): qty 3

## 2022-07-12 MED ORDER — ONDANSETRON HCL 4 MG PO TABS: 4.0000 mg | ORAL_TABLET | Freq: Four times a day (QID) | ORAL | Status: DC | PRN

## 2022-07-12 MED ORDER — ARFORMOTEROL TARTRATE 15 MCG/2ML IN NEBU
15.0000 ug | INHALATION_SOLUTION | Freq: Two times a day (BID) | RESPIRATORY_TRACT | Status: DC
  Administered 2022-07-12 – 2022-07-14 (×4): 15 ug via RESPIRATORY_TRACT
  Filled 2022-07-12 (×4): qty 2

## 2022-07-12 MED ORDER — PANTOPRAZOLE SODIUM 40 MG PO TBEC
40.0000 mg | DELAYED_RELEASE_TABLET | Freq: Every day | ORAL | Status: DC
  Administered 2022-07-13 – 2022-07-14 (×2): 40 mg via ORAL
  Filled 2022-07-12 (×2): qty 1

## 2022-07-12 MED ORDER — ACETAMINOPHEN 325 MG PO TABS: 650.0000 mg | ORAL_TABLET | Freq: Four times a day (QID) | ORAL | Status: DC | PRN

## 2022-07-12 MED ORDER — METHYLPREDNISOLONE SODIUM SUCC 125 MG IJ SOLR
125.0000 mg | INTRAMUSCULAR | Status: AC
  Administered 2022-07-12: 125 mg via INTRAVENOUS
  Filled 2022-07-12: qty 2

## 2022-07-12 MED ORDER — BUDESONIDE 0.5 MG/2ML IN SUSP
0.5000 mg | Freq: Two times a day (BID) | RESPIRATORY_TRACT | Status: DC
  Administered 2022-07-12 – 2022-07-14 (×4): 0.5 mg via RESPIRATORY_TRACT
  Filled 2022-07-12 (×4): qty 2

## 2022-07-12 MED ORDER — METHYLPREDNISOLONE SODIUM SUCC 125 MG IJ SOLR
120.0000 mg | INTRAMUSCULAR | Status: DC
  Administered 2022-07-13 – 2022-07-14 (×2): 120 mg via INTRAVENOUS
  Filled 2022-07-12 (×2): qty 2

## 2022-07-12 MED ORDER — METHYLPREDNISOLONE SODIUM SUCC 125 MG IJ SOLR: 60.0000 mg | Freq: Two times a day (BID) | INTRAMUSCULAR | Status: DC

## 2022-07-12 MED ORDER — ONDANSETRON HCL 4 MG/2ML IJ SOLN: 4.0000 mg | Freq: Four times a day (QID) | INTRAMUSCULAR | Status: DC | PRN

## 2022-07-12 MED ORDER — GABAPENTIN 800 MG PO TABS
800.0000 mg | ORAL_TABLET | Freq: Three times a day (TID) | ORAL | Status: DC
  Filled 2022-07-12 (×2): qty 1

## 2022-07-12 NOTE — ED Provider Notes (Signed)
[facility] EMERGENCY DEPARTMENT Provider Note   CSN: [id number] Arrival date & time: 07/12/22  5625     History  Chief Complaint  Patient presents with   Shortness of Breath    [name] is a 51 y.o. female.  51 year old female with a history of lung cancer in remission, self-reported COPD, self-reported pulmonary embolism not on anticoagulation, and tobacco use who presents to the emergency department with fatigue, congestion, cough, and chest pain.  States that on Christmas eve her and her husband started having URI symptoms.  Hers have persisted and gotten worse.  Has had significant dyspnea on exertion and shortness of breath that worsened today so she called 911.  Was satting 86% on room air so was placed on 2 L nasal cannula.  Does not have baseline oxygen requirement.  Also reports feeling sharp pains over her chest diffusely.  Unsure if this is pleuritic or exertional.  Nausea but no vomiting.  No diaphoresis.  Has had subjective fevers at home.  No history of MI.       Home Medications Prior to Admission medications   Medication Sig Start Date End Date Taking? Authorizing Provider  ALPRAZolam Duanne Moron) 1 MG tablet Take 1 mg by mouth See admin instructions. Take 1 tablet by mouth 1 to 3 times daily. 06/24/22  Yes [provider]  DULoxetine (CYMBALTA) 30 MG capsule Take 60 mg by mouth daily. 06/24/22  Yes [provider]  estradiol (ESTRACE) 0.1 MG/GM vaginal cream Place vaginally. 05/27/22  Yes [provider]  gabapentin (NEURONTIN) 800 MG tablet Take 800 mg by mouth See admin instructions. 1 tablet up to 5 times daily per pt 05/30/22  Yes [provider]  HYDROcodone-acetaminophen (NORCO) 10-325 MG tablet Take 1 tablet by mouth 4 (four) times daily as needed. 06/26/22  Yes [provider]  omeprazole (PRILOSEC) 20 MG capsule Take 20 mg by mouth daily. 05/27/22  Yes [provider]  predniSONE (DELTASONE) 10 MG tablet  Take by mouth. 06/24/22  Yes [provider]  amphetamine-dextroamphetamine (ADDERALL) 20 MG tablet Take by mouth. Patient not taking: Reported on 07/12/2022 05/26/22   [provider]      Allergies    Patient has no known allergies.    Review of Systems   Review of Systems  Physical Exam Updated Vital Signs BP 131/87 (BP Location: Left Arm)   Pulse 83   Temp 98.6 F (37 C)   Resp (!) 22   Ht '5\' 2"'$  (1.575 m)   Wt 50.3 kg   SpO2 98%   BMI 20.30 kg/m  Physical Exam Vitals and nursing note reviewed.  Constitutional:      General: She is not in acute distress.    Appearance: She is well-developed.     Comments: Fatigued appearing.  On 2 L nasal cannula  HENT:     Head: Normocephalic and atraumatic.     Right Ear: External ear normal.     Left Ear: External ear normal.     Nose: Nose normal.  Eyes:     Extraocular Movements: Extraocular movements intact.     Conjunctiva/sclera: Conjunctivae normal.     Pupils: Pupils are equal, round, and reactive to light.  Cardiovascular:     Rate and Rhythm: Normal rate and regular rhythm.     Heart sounds: No murmur heard. Pulmonary:     Effort: Pulmonary effort is normal. No respiratory distress.     Breath sounds: Wheezing (Diffuse expiratory faint) present.  Abdominal:     General: Abdomen is flat. There is no distension.     Palpations: Abdomen is soft. There is no mass.     Tenderness: There is no abdominal tenderness. There is no guarding.  Musculoskeletal:     Cervical back: Normal range of motion and neck supple.     Right lower leg: No edema.     Left lower leg: No edema.  Skin:    General: Skin is warm and dry.  Neurological:     Mental Status: She is alert and oriented to person, place, and time. Mental status is at baseline.  Psychiatric:        Mood and Affect: Mood normal.     ED Results / Procedures / Treatments   Labs (all labs ordered are listed, but only abnormal results are  displayed) Labs Reviewed  COMPREHENSIVE METABOLIC PANEL - Abnormal; Notable for the following components:      Result Value   Glucose, Bld 101 (*)    Albumin 3.4 (*)    All other components within normal limits  CBC - Abnormal; Notable for the following components:   WBC 13.0 (*)    Platelets 465 (*)    All other components within normal limits  RESP PANEL BY RT-PCR (RSV, FLU A&B, COVID)  RVPGX2  RESPIRATORY PANEL BY PCR  D-DIMER, QUANTITATIVE  HIV ANTIBODY (ROUTINE TESTING W REFLEX)  BASIC METABOLIC PANEL  CBC  MAGNESIUM  POC URINE PREG, ED  TROPONIN I (HIGH SENSITIVITY)  TROPONIN I (HIGH SENSITIVITY)    EKG EKG Interpretation  Date/Time:  Sunday July 12 2022 10:29:22 EST Ventricular Rate:  82 PR Interval:  113 QRS Duration: 86 QT Interval:  388 QTC Calculation: 454 R Axis:   85 Text Interpretation: Sinus rhythm Borderline short PR interval Confirmed by Margaretmary Eddy [phone]) on 07/12/2022 10:55:21 AM  Radiology DG Chest 2 View  Result Date: 07/12/2022 CLINICAL DATA:  51 year old female with chest pain and shortness of breath. Spouse recently with flu-like illness. EXAM: CHEST - 2 VIEW COMPARISON:  None Available. FINDINGS: Large lung volumes. Normal cardiac size and mediastinal contours. Visualized tracheal air column is within normal limits. Attenuation of bronchovascular markings in the right upper lobe raising the possibility of emphysema. Asymmetric coarse increased mid and lower lung interstitial opacity. No pleural effusion or consolidation. No pneumothorax. No acute osseous abnormality identified. Negative visible bowel gas. IMPRESSION: Suspicion of chronic pulmonary hyperinflation/emphysema, with superimposed coarse bilateral mid and lower lung interstitial opacity suggestive of acute viral/atypical respiratory infection in this setting. No pleural effusion. Electronically Signed   By: Genevie Kielyn M.D.   On: 07/12/2022 11:33    Procedures Procedures    Medications Ordered in ED Medications  HYDROcodone-acetaminophen (NORCO) 10-325 MG per tablet 1 tablet (has no administration in time range)  ALPRAZolam (XANAX) tablet 1 mg (has no administration in time range)  DULoxetine (CYMBALTA) DR capsule 60 mg (has no administration in time range)  gabapentin (NEURONTIN) tablet 800 mg (has no administration in time range)  pantoprazole (PROTONIX) EC tablet 40 mg (has no administration in time range)  enoxaparin (LOVENOX) injection 40 mg (40 mg Subcutaneous Given 07/12/22 2013)  acetaminophen (TYLENOL) tablet 650 mg (has no administration in time range)    Or  acetaminophen (TYLENOL) suppository 650 mg (has no administration in time range)  ondansetron (ZOFRAN) tablet 4 mg (has no administration in time range)    Or  ondansetron (ZOFRAN) injection 4 mg (has no administration in time range)  ipratropium-albuterol (DUONEB) 0.5-2.5 (3) MG/3ML nebulizer solution 3 mL (3 mLs Nebulization Given 07/12/22 1928)  arformoterol (BROVANA) nebulizer solution 15 mcg (15 mcg Nebulization Given 07/12/22 1927)  budesonide (PULMICORT) nebulizer solution 0.5 mg (0.5 mg Nebulization Given 07/12/22 1928)  methylPREDNISolone sodium succinate (SOLU-MEDROL) 125 mg/2 mL injection 120 mg (has no administration in time range)  methylPREDNISolone sodium succinate (SOLU-MEDROL) 125 mg/2 mL injection 125 mg (125 mg Intravenous Given 07/12/22 1137)  ipratropium-albuterol (DUONEB) 0.5-2.5 (3) MG/3ML nebulizer solution 3 mL (3 mLs Nebulization Given 07/12/22 1138)    ED Course/ Medical Decision Making/ A&P Clinical Course as of 07/12/22 2021  Sun Jul 12, 2022  1426 Dr Tat [RP]    Clinical Course User Index [RP] Fransico Meadow, MD                           Medical Decision Making Amount and/or Complexity of Data Reviewed Labs: ordered. Radiology: ordered.  Risk Prescription drug management. Decision regarding hospitalization.   [name] is a 51 y.o. female  with comorbidities that complicate the patient evaluation including lung cancer in remission, self-reported COPD, self-reported pulmonary embolism not on anticoagulation, and tobacco use who presents to the emergency department with fatigue, congestion, cough, and chest pain.    Initial Ddx:  URI, pneumonia, pulmonary embolism, MI, COPD exacerbation  MDM:  Feel the patient likely has URI or viral pneumonia given her symptoms.  Also feel that she may have a mild COPD exacerbation as well.  Considered MI but feel it is less likely given the description of her symptoms but will obtain EKG and troponins.  Also obtain D-dimer with her risk factors of prior PE and prior history of lung cancer.  Peers to have new oxygen requirement of 2 L so we will attempt to wean.  Plan:  Labs D-dimer Troponin EKG Chest x-ray Solu-Medrol DuoNeb  ED Summary/Re-evaluation:  Patient reassessed in the emergency department after nebulizer treatment.  Had persistent 2 L oxygen requirement.  Troponins, EKG, D-dimer not suggestive of PE or MI as a cause of her symptoms.  Chest x-ray showed possible atypical versus viral infection.  Full RVP was sent to assess for viral causes of her pneumonia since COVID/flu/RSV were negative.  If no viral causes found patient may benefit from antibiotics.  This patient presents to the ED for concern of complaints listed in HPI, this involves an extensive number of treatment options, and is a complaint that carries with it a high risk of complications and morbidity. Disposition including potential need for admission considered.   Dispo: Admit to Floor  Additional history obtained from spouse Records reviewed Outpatient Clinic Notes The following labs were independently interpreted: Serial Troponins and D-dimer and show no acute abnormality I independently reviewed the following imaging with scope of interpretation limited to determining acute life threatening conditions related to  emergency care: Chest x-ray and agree with the radiologist interpretation with the following exceptions: None I personally reviewed and interpreted cardiac monitoring: normal sinus rhythm  I personally reviewed and interpreted the pt's EKG: see above for interpretation  I have reviewed the patients home medications and made adjustments as needed Consults: Hospitalist  Final Clinical Impression(s) / ED Diagnoses Final diagnoses:  COPD exacerbation ([place])  Hypoxia    Rx / DC Orders ED Discharge Orders     None         Fransico Meadow, MD 07/12/22 2021

## 2022-07-12 NOTE — TOC Progression Note (Signed)
Transition of Care [facility]) Screening Note   Patient Details  Name: [name] Date of Birth: December 14, 1970   [facility]) CM/SW Contact:    Leitha Bleak, RN Phone Number: 07/12/2022, 5:03 PM    Transition of Care Department [facility]) has reviewed patient and no TOC needs have been identified at this time. We will continue to monitor patient advancement through interdisciplinary progression rounds. If new patient transition needs arise, please place a TOC consult.      Barriers to Discharge: Continued Medical Work up  Expected Discharge Plan and Services       Living arrangements for the past 2 months: Single Family Home

## 2022-07-12 NOTE — ED Notes (Signed)
Assisted patient to the bedside commode. Patient oxygen keeps dropping to the 80% because she keeps taking her nasal cannula off stating she feels like she is being strapped down. Patient informed that her oxygen needed to stay on because her levels are dropping way to low. Patient asked for privacy while on bedside husband at bedside. Upon re-entering the room patient had cardiac leads off and oxygen. O2 saturation 79%. Patient placed back on monitor and oxygen and saturation back up to 95%. Patient and family reminded that patient needs to keep oxygen on. Patient requesting something to help her sleep and cough medicine. RN notified.

## 2022-07-12 NOTE — H&P (Signed)
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  She stated that her spouse had a similar illness.  The patient has been using some over-the-counter remedies, but she continued to gradually worsen with her symptoms significantly worsened over the past 48 hours prior to admission.  Her cough is largely nonproductive although she occasionally produces some yellow sputum.  She denies any hemoptysis.  She had some fevers initially, but they have since improved.  She denies any nausea, vomiting.  She does have some loose stools.  She denies any hematochezia or melena or abdominal pain.  There is no dysuria or hematuria.  She continues to smoke on a daily basis.  She denies any chest pain, headache, neck pain. In the ED, the patient was afebrile and hemodynamically stable with oxygen saturation 86% on room air.  The patient was placed on 2 L with saturation up to 92-94%.  WBC 13.0, hemoglobin 12.5, platelets 465,000.  Sodium 138, potassium 3.7, bicarbonate 20, BUN 15, creatinine 0.45.  Troponin 3>> 3.   D-dimer was 0.41.  EKG showed sinus rhythm and nonspecific T wave change.  Chest x-ray showed hyperinflation with bibasilar atelectasis and interstitial markings.  COVID-19 PCR was negative.  The patient was given Solu-Medrol and DuoNebs.  She continued to have shortness of breath and wheezing.  The patient was admitted for further evaluation and treatment of her COPD  exacerbation.  Review of Systems: As mentioned in the history of present illness. All other systems reviewed and are negative. Past Medical History:  Diagnosis Date   Cancer (HCC)    COPD (chronic obstructive pulmonary disease) (HCC)    Hypertension    Past Surgical History:  Procedure Laterality Date   ABDOMINAL HYSTERECTOMY     LUNG CANCER SURGERY     right  lobe   Social History:  reports that she has been smoking cigarettes. She has been smoking an average of 1 pack per day. She has never used smokeless tobacco. She reports that she does not currently use alcohol. She reports that she does not currently use drugs.  No Known Allergies  History reviewed. No pertinent family history.  Prior to Admission medications   Medication Sig Start Date End Date Taking? Authorizing Provider  ALPRAZolam Prudy Feeler) 1 MG tablet Take 1 mg by mouth See admin instructions. Take 1 tablet by mouth 1 to 3 times daily. 06/24/22  Yes [provider]  DULoxetine (CYMBALTA) 30 MG capsule Take 60 mg by mouth daily. 06/24/22  Yes [provider]  estradiol (ESTRACE) 0.1 MG/GM vaginal cream Place vaginally. 05/27/22  Yes [provider]  gabapentin (NEURONTIN) 800 MG tablet Take 800 mg by mouth See admin instructions. 1 tablet up to 5 times daily per pt 05/30/22  Yes [provider]  HYDROcodone-acetaminophen (NORCO) 10-325 MG tablet Take 1 tablet by mouth 4 (four) times daily as needed. 06/26/22  Yes [provider]  omeprazole (PRILOSEC) 20 MG capsule Take 20 mg by mouth daily. 05/27/22  Yes [provider]  predniSONE (DELTASONE) 10 MG tablet Take by mouth. 06/24/22  Yes [provider]  amphetamine-dextroamphetamine (ADDERALL) 20 MG tablet Take by mouth. Patient not taking: Reported on 07/12/2022 05/26/22   [provider]    Physical Exam: Vitals:   07/12/22 1223 07/12/22 1315 07/12/22 1406 07/12/22 1418  BP:   123/73   Pulse: (!)  101 90 87 88  Resp: (!) 25 (!) 27 (!) 28 (!) 25  Temp:      TempSrc:      SpO2: 92% 94% 92% 92%  Weight:      Height:       GENERAL:  A&O x 3, NAD, well developed, cooperative, follows commands HEENT: [place]/AT, No thrush, No icterus, No oral ulcers Neck:  No neck mass, No meningismus, soft, supple CV: RRR, no S3, no S4, no rub, no JVD Lungs: Bilateral rales.  Bilateral expiratory wheeze. Abd: soft/NT +BS, nondistended Ext: No edema, no lymphangitis, no cyanosis, no rashes Neuro:  CN II-XII intact, strength 4/5 in RUE, RLE, strength 4/5 LUE, LLE; sensation intact bilateral; no dysmetria; babinski equivocal  Data Reviewed: Data reviewed above in history.  Assessment and Plan: Acute respiratory failure with hypoxia -Presented with tachypnea and hypoxia 86% on room air -Stable on 2 L nasal cannula -Secondary to COPD exacerbation -Wean oxygen as tolerated for saturation greater 92%  COPD exacerbation -Start Brovana -Start Pulmicort -Continue IV Solu-Medrol -Continue DuoNebs  Opioid dependence -PDMP reviewed--patient receives Norco 10/325, #120 on a monthly basis, last refill 06/24/2022  Anxiety -PDMP reviewed--patient receives Xanax 1 mg #90 on a monthly basis, last refill 06/24/2022 -Continue alprazolam  Pulmonary neuroendocrine tumor -Status post resection right middle lobe May 2010  Tobacco abuse -Tobacco cessation discussed    Advance Care Planning: FULL CODE  Consults: none  Family Communication: spouse updated 12/31  Severity of Illness: The appropriate patient status for this patient is OBSERVATION. Observation status is judged to be reasonable and necessary in order to provide the required intensity of service to ensure the patient's safety. The patient's presenting symptoms, physical exam findings, and initial radiographic and laboratory data in the context of their medical condition is felt to place them at decreased risk for further clinical deterioration.  Furthermore, it is anticipated that the patient will be medically stable for discharge from the hospital within 2 midnights of admission.   Author: Catarina Hartshorn, MD 07/12/2022 2:48 PM  For on call review [email or web address].

## 2022-07-12 NOTE — ED Notes (Signed)
Nasal cannula reapplied at 2 lpm due to pt oxygen dropping to 85% RA.

## 2022-07-12 NOTE — Progress Notes (Signed)
Pt arrived to floor settled VVS see flowsheet. Pt complains with diarrhea no other complaints at this present time. Some DOB on exertion otherwise comfortable. Will continue to monitor. Bed in lowest position.

## 2022-07-12 NOTE — ED Notes (Addendum)
Pt keeps taking O2 off. Pt's O2 at 88%. This RN educated to pt the importance of keeping their 2L [place] on until pt has the ability to sustain adequate O2 sat on RA.

## 2022-07-12 NOTE — ED Notes (Addendum)
x

## 2022-07-12 NOTE — Hospital Course (Signed)
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  She stated that her spouse had a similar illness.  The patient has been using some over-the-counter remedies, but she continued to gradually worsen with her symptoms significantly worsened over the past 48 hours prior to admission.  Her cough is largely nonproductive although she occasionally produces some yellow sputum.  She denies any hemoptysis.  She had some fevers initially, but they have since improved.  She denies any nausea, vomiting.  She does have some loose stools.  She denies any hematochezia or melena or abdominal pain.  There is no dysuria or hematuria.  She continues to smoke on a daily basis.  She denies any chest pain, headache, neck pain. In the ED, the patient was afebrile and hemodynamically stable with oxygen saturation 86% on room air.  The patient was placed on 2 L with saturation up to 92-94%.  WBC 13.0, hemoglobin 12.5, platelets 465,000.  Sodium 138, potassium 3.7, bicarbonate 20, BUN 15, creatinine 0.45.  Troponin 3>> 3.   D-dimer was 0.41.  EKG showed sinus rhythm and nonspecific T wave change.  Chest x-ray showed hyperinflation with bibasilar atelectasis and interstitial markings.  COVID-19 PCR was negative.  The patient was given Solu-Medrol and DuoNebs.  She continued to have shortness of breath and wheezing.  The patient was admitted for further evaluation and treatment of her COPD exacerbation.

## 2022-07-12 NOTE — ED Triage Notes (Signed)
Pt c/o sob and chest pain; pt states her and her spouse had flu like sx for one week and she states she is not feeling any better

## 2022-07-13 DIAGNOSIS — J101 Influenza due to other identified influenza virus with other respiratory manifestations: Secondary | ICD-10-CM | POA: Diagnosis present

## 2022-07-13 DIAGNOSIS — J9601 Acute respiratory failure with hypoxia: Secondary | ICD-10-CM | POA: Diagnosis not present

## 2022-07-13 DIAGNOSIS — J441 Chronic obstructive pulmonary disease with (acute) exacerbation: Secondary | ICD-10-CM | POA: Diagnosis not present

## 2022-07-13 DIAGNOSIS — Z8541 Personal history of malignant neoplasm of cervix uteri: Secondary | ICD-10-CM | POA: Diagnosis not present

## 2022-07-13 DIAGNOSIS — F1120 Opioid dependence, uncomplicated: Secondary | ICD-10-CM | POA: Diagnosis not present

## 2022-07-13 DIAGNOSIS — Z85118 Personal history of other malignant neoplasm of bronchus and lung: Secondary | ICD-10-CM | POA: Diagnosis not present

## 2022-07-13 DIAGNOSIS — R0602 Shortness of breath: Secondary | ICD-10-CM | POA: Diagnosis present

## 2022-07-13 DIAGNOSIS — J9811 Atelectasis: Secondary | ICD-10-CM | POA: Diagnosis present

## 2022-07-13 DIAGNOSIS — Z902 Acquired absence of lung [part of]: Secondary | ICD-10-CM | POA: Diagnosis not present

## 2022-07-13 DIAGNOSIS — Z7952 Long term (current) use of systemic steroids: Secondary | ICD-10-CM | POA: Diagnosis not present

## 2022-07-13 DIAGNOSIS — Z86711 Personal history of pulmonary embolism: Secondary | ICD-10-CM | POA: Diagnosis not present

## 2022-07-13 DIAGNOSIS — I10 Essential (primary) hypertension: Secondary | ICD-10-CM | POA: Diagnosis present

## 2022-07-13 DIAGNOSIS — Z1152 Encounter for screening for COVID-19: Secondary | ICD-10-CM | POA: Diagnosis not present

## 2022-07-13 DIAGNOSIS — Z90710 Acquired absence of both cervix and uterus: Secondary | ICD-10-CM | POA: Diagnosis not present

## 2022-07-13 DIAGNOSIS — F419 Anxiety disorder, unspecified: Secondary | ICD-10-CM | POA: Diagnosis present

## 2022-07-13 DIAGNOSIS — F17210 Nicotine dependence, cigarettes, uncomplicated: Secondary | ICD-10-CM | POA: Diagnosis present

## 2022-07-13 LAB — CBC
HCT: 35.9 % — ABNORMAL LOW (ref 36.0–46.0)
Hemoglobin: 11.2 g/dL — ABNORMAL LOW (ref 12.0–15.0)
MCH: 28.8 pg (ref 26.0–34.0)
MCHC: 31.2 g/dL (ref 30.0–36.0)
MCV: 92.3 fL (ref 80.0–100.0)
Platelets: 457 10*3/uL — ABNORMAL HIGH (ref 150–400)
RBC: 3.89 MIL/uL (ref 3.87–5.11)
RDW: 14.1 % (ref 11.5–15.5)
WBC: 9.1 10*3/uL (ref 4.0–10.5)
nRBC: 0 % (ref 0.0–0.2)

## 2022-07-13 LAB — BASIC METABOLIC PANEL
Anion gap: 10 (ref 5–15)
BUN: 18 mg/dL (ref 6–20)
CO2: 29 mmol/L (ref 22–32)
Calcium: 9.1 mg/dL (ref 8.9–10.3)
Chloride: 100 mmol/L (ref 98–111)
Creatinine, Ser: 0.55 mg/dL (ref 0.44–1.00)
GFR, Estimated: >60 mL/min (ref >60)
Glucose, Bld: 93 mg/dL (ref 70–99)
Potassium: 4.1 mmol/L (ref 3.5–5.1)
Sodium: 139 mmol/L (ref 135–145)

## 2022-07-13 LAB — MAGNESIUM: Magnesium: 2.5 mg/dL — ABNORMAL HIGH (ref 1.7–2.4)

## 2022-07-13 LAB — HIV ANTIBODY (ROUTINE TESTING W REFLEX): HIV Screen 4th Generation wRfx: NONREACTIVE

## 2022-07-13 MED ORDER — OSELTAMIVIR PHOSPHATE 75 MG PO CAPS
75.0000 mg | ORAL_CAPSULE | Freq: Two times a day (BID) | ORAL | Status: DC
  Administered 2022-07-13 – 2022-07-14 (×3): 75 mg via ORAL
  Filled 2022-07-13 (×3): qty 1

## 2022-07-13 NOTE — Care Management Obs Status (Signed)
[place] NOTIFICATION   Patient Details  Name: Ramandeep Arington MRN: [id number] Date of Birth: Apr 21, 1971   Medicare Observation Status Notification Given:  Yes    Boneta Lucks, RN 07/13/2022, 1:01 PM

## 2022-07-13 NOTE — Progress Notes (Signed)
SATURATION QUALIFICATIONS: (This note is used to comply with regulatory documentation for home oxygen)  Patient Saturations on Room Air at Rest = 89%  Patient Saturations on Room Air while Ambulating = 89%  Patient Saturations on 2 Liters of oxygen while Ambulating = 92%  Please briefly explain why patient needs home oxygen:

## 2022-07-13 NOTE — TOC Progression Note (Signed)
Transition of Care [facility]) - Progression Note    Patient Details  Name: Renai Lopata MRN: [id number] Date of Birth: April 15, 1971  Transition of Care [facility]) CM/SW Contact  Boneta Lucks, RN Phone Number: 07/13/2022, 12:57 PM  Clinical Narrative:   Patient admitted with FLU A. Patient requiring oxygen.  MD ordering home oxygen and neb machine. CM spoke with patient, she is wanting to DC but agreeable to take medical advice and stay.  Agreeable with home oxygen and really wants the neb machine. Referral sent to [facility] with [employer], They will deliver this evening and give to RN. RN updated.     Expected Discharge Plan: Home/Self Care Barriers to Discharge: Continued Medical Work up  Expected Discharge Plan and [place] arrangements for the past 2 months: Single Family Home                 DME Arranged: Oxygen DME Agency: [employer] Date DME Agency Contacted: 07/13/22 Time DME Agency Contacted: [id number] Representative spoke with at DME Agency: [place] ([place]) Interventions [place]: No Food Insecurity (07/12/2022)  Housing: Low Risk  (07/12/2022)  Transportation Needs: No Transportation Needs (07/12/2022)  Utilities: Not At Risk (07/12/2022)  Tobacco Use: High Risk (07/12/2022)    Readmission Risk Interventions     No data to display

## 2022-07-13 NOTE — Progress Notes (Signed)
SATURATION QUALIFICATIONS: (This note is used to comply with regulatory documentation for home oxygen)   Patient Saturations on Room Air at Rest = 84   Patient Saturations on Room Air while Ambulating = N/A   Patient Saturations on 3 Liters of oxygen while Ambulating = 96   Please briefly explain why patient needs home oxygen: To maintain 02 sat at 90% or above during ambulation.  Orson Eva, DO

## 2022-07-13 NOTE — Progress Notes (Addendum)
At 1910, patient had pulled off nasal canula and telemetry, asking what time it is. Nasal canula and telemetry placed back on patient and assisted her to the bathroom.  Pt's spouse arrived at bedside and reported that the patient had had confusion at home (without oxygen supplementation); she was not making sense after starting a Z-pack. Patient became oriented after being on the oxygen again. Spouse remains at bedside.

## 2022-07-13 NOTE — Progress Notes (Signed)
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  She stated that her spouse had a similar illness.  The patient has been using some over-the-counter remedies, but she continued to gradually worsen with her symptoms significantly worsened over the past 48 hours prior to admission.  Her cough is largely nonproductive although she occasionally produces some yellow sputum.  She denies any hemoptysis.  She had some fevers initially, but they have since improved.  She denies any nausea, vomiting.  She does have some loose stools.  She denies any hematochezia or melena or abdominal pain.  There is no dysuria or hematuria.  She continues to smoke on a daily basis.  She denies any chest pain, headache, neck pain. In the ED, the patient was afebrile and hemodynamically stable with oxygen saturation 86% on room air.  The patient was placed on 2 L with saturation up to 92-94%.  WBC 13.0, hemoglobin 12.5, platelets 465,000.  Sodium 138, potassium 3.7, bicarbonate 20, BUN 15, creatinine 0.45.  Troponin 3>> 3.   D-dimer was 0.41.  EKG showed sinus rhythm and nonspecific T wave change.  Chest x-ray showed hyperinflation with bibasilar atelectasis and interstitial markings.  COVID-19 PCR was negative.  The patient was given Solu-Medrol and DuoNebs.  She continued to have shortness of breath and wheezing.  The patient was admitted for further evaluation and treatment of her COPD exacerbation.   Assessment/Plan:  Acute respiratory failure with hypoxia -Presented with tachypnea and hypoxia 86% on room air -Stable on 3 L nasal cannula -Secondary to COPD  exacerbation -Wean oxygen as tolerated for saturation greater 92%   COPD exacerbation -Started Brovana -Started Pulmicort -Continue IV Solu-Medrol -Continue DuoNebs  Influenza -viral resp panel>>positive influenza H1 2009 -start oseltamivir  Opioid dependence -PDMP reviewed--patient receives Norco 10/325, #120 on a monthly basis, last refill 06/24/2022   Anxiety -PDMP reviewed--patient receives Xanax 1 mg #90 on a monthly basis, last refill 06/24/2022 -Continue alprazolam   Pulmonary neuroendocrine tumor -Status post resection right middle lobe May 2010   Tobacco abuse -Tobacco cessation discussed        Family Communication:  spouse/daughter at bedside 07/13/22  Consultants:  none  Code Status:  FULL   DVT Prophylaxis:  [place] Lovenox   Procedures: As Listed in Progress Note Above  Antibiotics: None       Subjective: Pt states she is breathing better but still wheezing and has dry cough.  Denies n/v.  She has had 6 loose BMs in last 24 hours.  No abd pain.  No f/c, hemoptysis, hematochezia or melena  Objective: Vitals:   07/13/22 0600 07/13/22 0733 07/13/22 0736 07/13/22 0738  BP: 101/61     Pulse: 64     Resp: 19     Temp: 97.7 F (36.5 C)     TempSrc: Oral     SpO2: 100% 96% 96% 96%  Weight:      Height:        Intake/Output Summary (Last 24 hours) at 07/13/2022 1252 Last data filed at 07/13/2022 0900 Gross per 24 hour  Intake 480 ml  Output --  Net 480 ml   Weight  change:  Exam:  General:  Pt is alert, follows commands appropriately, not in acute distress HEENT: No icterus, No thrush, No neck mass, [place]/AT Cardiovascular: RRR, S1/S2, no rubs, no gallops Respiratory: bibasilar rales.  Bibasilar wheeze Abdomen: Soft/+BS, non tender, non distended, no guarding Extremities: No edema, No lymphangitis, No petechiae, No rashes, no synovitis   Data Reviewed: I have personally reviewed following labs and imaging studies Basic Metabolic  Panel: Recent Labs  Lab 07/12/22 1042 07/13/22 0459  NA 138 139  K 3.7 4.1  CL 100 100  CO2 28 29  GLUCOSE 101* 93  BUN 15 18  CREATININE 0.45 0.55  CALCIUM 9.2 9.1  MG  --  2.5*   Liver Function Tests: Recent Labs  Lab 07/12/22 1042  AST 21  ALT 37  ALKPHOS 87  BILITOT 0.7  PROT 8.1  ALBUMIN 3.4*   No results for input(s): "LIPASE", "AMYLASE" in the last 168 hours. No results for input(s): "AMMONIA" in the last 168 hours. Coagulation Profile: No results for input(s): "INR", "PROTIME" in the last 168 hours. CBC: Recent Labs  Lab 07/12/22 1042 07/13/22 0459  WBC 13.0* 9.1  HGB 12.5 11.2*  HCT 39.1 35.9*  MCV 89.7 92.3  PLT 465* 457*   Cardiac Enzymes: No results for input(s): "CKTOTAL", "CKMB", "CKMBINDEX", "TROPONINI" in the last 168 hours. BNP: Invalid input(s): "POCBNP" CBG: No results for input(s): "GLUCAP" in the last 168 hours. HbA1C: No results for input(s): "HGBA1C" in the last 72 hours. Urine analysis: No results found for: "COLORURINE", "APPEARANCEUR", "LABSPEC", "PHURINE", "GLUCOSEU", "HGBUR", "BILIRUBINUR", "KETONESUR", "PROTEINUR", "UROBILINOGEN", "NITRITE", "LEUKOCYTESUR" Sepsis Labs: @LABRCNTIP (procalcitonin:4,lacticidven:4) ) Recent Results (from the past 240 hour(s))  Resp panel by RT-PCR (RSV, Flu A&B, Covid) Anterior Nasal Swab     Status: None   Collection Time: 07/12/22 10:40 AM   Specimen: Anterior Nasal Swab  Result Value Ref Range Status   SARS Coronavirus 2 by RT PCR NEGATIVE NEGATIVE Final    Comment: (NOTE) SARS-CoV-2 target nucleic acids are NOT DETECTED.  The SARS-CoV-2 RNA is generally detectable in upper respiratory specimens during the acute phase of infection. The lowest concentration of SARS-CoV-2 viral copies this assay can detect is 138 copies/mL. A negative result does not preclude SARS-Cov-2 infection and should not be used as the sole basis for treatment or other patient management decisions. A negative result  may occur with  improper specimen collection/handling, submission of specimen other than nasopharyngeal swab, presence of viral mutation(s) within the areas targeted by this assay, and inadequate number of viral copies(<138 copies/mL). A negative result must be combined with clinical observations, patient history, and epidemiological information. The expected result is Negative.  Fact Sheet for Patients:  BloggerCourse.com  Fact Sheet for Healthcare Providers:  SeriousBroker.it  This test is no t yet approved or cleared by the [place] FDA and  has been authorized for detection and/or diagnosis of SARS-CoV-2 by FDA under an Emergency Use Authorization (EUA). This EUA will remain  in effect (meaning this test can be used) for the duration of the COVID-19 declaration under Section 564(b)(1) of the Act, 21 U.S.C.section 360bbb-3(b)(1), unless the authorization is terminated  or revoked sooner.       Influenza A by PCR NEGATIVE NEGATIVE Final   Influenza B by PCR NEGATIVE NEGATIVE Final    Comment: (NOTE) The Xpert Xpress SARS-CoV-2/FLU/RSV plus assay is intended as an aid in the diagnosis of influenza from Nasopharyngeal swab specimens and should not be used as a sole basis for  treatment. Nasal washings and aspirates are unacceptable for Xpert Xpress SARS-CoV-2/FLU/RSV testing.  Fact Sheet for Patients: BloggerCourse.com  Fact Sheet for Healthcare Providers: SeriousBroker.it  This test is not yet approved or cleared by the [place] FDA and has been authorized for detection and/or diagnosis of SARS-CoV-2 by FDA under an Emergency Use Authorization (EUA). This EUA will remain in effect (meaning this test can be used) for the duration of the COVID-19 declaration under Section 564(b)(1) of the Act, 21 U.S.C. section 360bbb-3(b)(1), unless the authorization is terminated  or revoked.     Resp Syncytial Virus by PCR NEGATIVE NEGATIVE Final    Comment: (NOTE) Fact Sheet for Patients: BloggerCourse.com  Fact Sheet for Healthcare Providers: SeriousBroker.it  This test is not yet approved or cleared by the [place] FDA and has been authorized for detection and/or diagnosis of SARS-CoV-2 by FDA under an Emergency Use Authorization (EUA). This EUA will remain in effect (meaning this test can be used) for the duration of the COVID-19 declaration under Section 564(b)(1) of the Act, 21 U.S.C. section 360bbb-3(b)(1), unless the authorization is terminated or revoked.  Performed at [facility], [address] [place], [place] [address]   Respiratory (~20 pathogens) panel by PCR     Status: Abnormal   Collection Time: 07/12/22 11:49 AM   Specimen: Nasopharyngeal Swab; Respiratory  Result Value Ref Range Status   Adenovirus NOT DETECTED NOT DETECTED Final   Coronavirus 229E NOT DETECTED NOT DETECTED Final    Comment: (NOTE) The Coronavirus on the Respiratory Panel, DOES NOT test for the novel  Coronavirus (2019 nCoV)    Coronavirus HKU1 NOT DETECTED NOT DETECTED Final   Coronavirus NL63 NOT DETECTED NOT DETECTED Final   Coronavirus OC43 NOT DETECTED NOT DETECTED Final   Metapneumovirus NOT DETECTED NOT DETECTED Final   Rhinovirus / Enterovirus NOT DETECTED NOT DETECTED Final   Influenza A H1 2009 DETECTED (A) NOT DETECTED Final   Influenza B NOT DETECTED NOT DETECTED Final   Parainfluenza Virus 1 NOT DETECTED NOT DETECTED Final   Parainfluenza Virus 2 NOT DETECTED NOT DETECTED Final   Parainfluenza Virus 3 NOT DETECTED NOT DETECTED Final   Parainfluenza Virus 4 NOT DETECTED NOT DETECTED Final   Respiratory Syncytial Virus NOT DETECTED NOT DETECTED Final   Bordetella pertussis NOT DETECTED NOT DETECTED Final   Bordetella Parapertussis NOT DETECTED NOT DETECTED Final   Chlamydophila pneumoniae NOT  DETECTED NOT DETECTED Final   Mycoplasma pneumoniae NOT DETECTED NOT DETECTED Final    Comment: Performed at [facility], [address], [place], [place] [address]     Scheduled Meds:  arformoterol  15 mcg Nebulization BID   budesonide (PULMICORT) nebulizer solution  0.5 mg Nebulization BID   DULoxetine  60 mg Oral Daily   enoxaparin (LOVENOX) injection  40 mg Subcutaneous Q24H   gabapentin  800 mg Oral TID   ipratropium-albuterol  3 mL Nebulization Q6H   methylPREDNISolone (SOLU-MEDROL) injection  120 mg Intravenous Q24H   pantoprazole  40 mg Oral Daily   Continuous Infusions:  Procedures/Studies: DG Chest 2 View  Result Date: 07/12/2022 CLINICAL DATA:  52 year old female with chest pain and shortness of breath. Spouse recently with flu-like illness. EXAM: CHEST - 2 VIEW COMPARISON:  None Available. FINDINGS: Large lung volumes. Normal cardiac size and mediastinal contours. Visualized tracheal air column is within normal limits. Attenuation of bronchovascular markings in the right upper lobe raising the possibility of emphysema. Asymmetric coarse increased mid and lower lung interstitial  opacity. No pleural effusion or consolidation. No pneumothorax. No acute osseous abnormality identified. Negative visible bowel gas. IMPRESSION: Suspicion of chronic pulmonary hyperinflation/emphysema, with superimposed coarse bilateral mid and lower lung interstitial opacity suggestive of acute viral/atypical respiratory infection in this setting. No pleural effusion. Electronically Signed   By: Odessa Fleming M.D.   On: 07/12/2022 11:33    Catarina Hartshorn, DO  [facility]  If 7PM-7AM, please contact night-coverage [email or web address] Password [email or web address] 07/13/2022, 12:52 PM   LOS: 0 days

## 2022-07-14 ENCOUNTER — Encounter (HOSPITAL_COMMUNITY): Payer: Self-pay

## 2022-07-14 DIAGNOSIS — J441 Chronic obstructive pulmonary disease with (acute) exacerbation: Secondary | ICD-10-CM | POA: Diagnosis not present

## 2022-07-14 DIAGNOSIS — F1120 Opioid dependence, uncomplicated: Secondary | ICD-10-CM | POA: Diagnosis not present

## 2022-07-14 DIAGNOSIS — J9601 Acute respiratory failure with hypoxia: Secondary | ICD-10-CM | POA: Diagnosis not present

## 2022-07-14 MED ORDER — PREDNISONE 20 MG PO TABS: 50.0000 mg | ORAL_TABLET | Freq: Every day | ORAL | Status: DC

## 2022-07-14 MED ORDER — OSELTAMIVIR PHOSPHATE 75 MG PO CAPS: 75.0000 mg | ORAL_CAPSULE | Freq: Two times a day (BID) | ORAL | 0 refills | Status: DC

## 2022-07-14 MED ORDER — PREDNISONE 50 MG PO TABS: 50.0000 mg | ORAL_TABLET | Freq: Every day | ORAL | 0 refills | Status: DC

## 2022-07-14 MED ORDER — IPRATROPIUM-ALBUTEROL 0.5-2.5 (3) MG/3ML IN SOLN: 3.0000 mL | Freq: Four times a day (QID) | RESPIRATORY_TRACT | 1 refills | Status: AC | PRN

## 2022-07-14 MED ORDER — ENSURE ENLIVE PO LIQD: 237.0000 mL | Freq: Two times a day (BID) | ORAL | Status: DC

## 2022-07-14 NOTE — TOC Transition Note (Signed)
Transition of Care [facility]) - CM/SW Discharge Note   Patient Details  Name: [name] MRN: [id number] Date of Birth: 03-11-1971  Transition of Care [facility]) CM/SW Contact:  Boneta Lucks, RN Phone Number: 07/14/2022, 1:07 PM   Clinical Narrative:   Patient discharging home. [employer] delivered oxygen, TOC delivered [employer], Kristin updated. RN doing oxygen education. [employer] called patient, her card declined for home oxygen set up. Patient is calling the bank to work out payment. RN aware payment has to be confirmed before patient can discharge.   Final next level of care: Home/Self Care Barriers to Discharge: Barriers Resolved   Patient Goals and CMS Choice CMS Medicare.gov Compare Post Acute Care list provided to:: Patient Choice offered to / list presented to : Patient  Discharge Placement      Name of family member notified: Husband Patient and family notified of of transfer: 07/14/22  Discharge Plan and Services Additional resources added to the After Visit Summary for                  DME Arranged: Oxygen DME Agency: [employer] Date DME Agency Contacted: 07/13/22 Time DME Agency Contacted: [id number] Representative spoke with at DME Agency: [place] ([place]) Interventions [place]: No Food Insecurity (07/12/2022)  Housing: Low Risk  (07/12/2022)  Transportation Needs: No Transportation Needs (07/12/2022)  Utilities: Not At Risk (07/12/2022)  Tobacco Use: High Risk (07/12/2022)    Readmission Risk Interventions    07/14/2022   12:32 PM  Readmission Risk Prevention Plan  Transportation Screening Complete  PCP or Specialist Appt within 5-7 Days Complete  Home Care Screening Complete  Medication Review (RN CM) Complete

## 2022-07-14 NOTE — Discharge Summary (Signed)
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  She stated that her spouse had a similar illness.  The patient has been using some over-the-counter remedies, but she continued to gradually worsen with her symptoms significantly worsened over the past 48 hours prior to admission.  Her cough is largely nonproductive although she occasionally produces some yellow sputum.  She denies any hemoptysis.  She had some fevers initially, but they have since improved.  She denies any nausea, vomiting.  She does have some loose stools.  She denies any hematochezia or melena or abdominal pain.  There is no dysuria or hematuria.  She continues to smoke on a daily basis.  She denies any chest pain, headache, neck pain. In the ED, the patient was afebrile and hemodynamically stable with oxygen saturation 86% on room air.  The patient was placed on 2 L with saturation up to 92-94%.  WBC 13.0, hemoglobin 12.5, platelets 465,000.  Sodium 138, potassium 3.7, bicarbonate 20, BUN 15, creatinine 0.45.  Troponin 3>> 3.   D-dimer was 0.41.  EKG showed sinus rhythm and nonspecific T wave change.  Chest x-ray showed hyperinflation with bibasilar atelectasis and interstitial markings.  COVID-19 PCR was negative.  The patient was given Solu-Medrol and DuoNebs.  She continued to have shortness of breath and wheezing.  The patient was admitted for  further evaluation and treatment of her COPD exacerbation.  Assessment and Plan: Acute respiratory failure with hypoxia -Presented with tachypnea and hypoxia 86% on room air -Stable on 3 L nasal cannula -Secondary to COPD exacerbation -Wean oxygen as tolerated for saturation greater 92% -d/c hom with 3L [place]   COPD exacerbation -Started Brovana -Started Pulmicort -Continue IV Solu-Medrol>>d/c home with prednisone x 4 more days -Continue DuoNebs   Influenza -viral resp panel>>positive influenza H1 2009 -start oseltamivir>>7 more doses after d/c   Opioid dependence -PDMP reviewed--patient receives Norco 10/325, #120 on a monthly basis, last refill 06/24/2022   Anxiety -PDMP reviewed--patient receives Xanax 1 mg #90 on a monthly basis, last refill 06/24/2022 -Continue alprazolam   Pulmonary neuroendocrine tumor -Status post resection right middle lobe May 2010   Tobacco abuse -Tobacco cessation discussed             Consultants: none Procedures performed: none  Disposition: Home Diet recommendation:  Cardiac diet DISCHARGE MEDICATION: Allergies as of 07/14/2022   No Known Allergies      Medication List     STOP taking these medications    amphetamine-dextroamphetamine 20 MG tablet Commonly known as: ADDERALL       TAKE these medications    ALPRAZolam 1 MG tablet Commonly known as: XANAX Take 1 mg by mouth See admin instructions. Take 1 tablet by mouth 1 to 3 times daily.   DULoxetine 30 MG capsule Commonly known as: CYMBALTA Take 60 mg by mouth daily.   estradiol 0.1 MG/GM vaginal cream Commonly known as: ESTRACE Place vaginally.  gabapentin 800 MG tablet Commonly known as: NEURONTIN Take 800 mg by mouth See admin instructions. 1 tablet up to 5 times daily per pt   HYDROcodone-acetaminophen 10-325 MG tablet Commonly known as: NORCO Take 1 tablet by mouth 4 (four) times daily as needed.   ipratropium-albuterol 0.5-2.5 (3) MG/3ML  Soln Commonly known as: DUONEB Take 3 mLs by nebulization every 6 (six) hours as needed (sob).   omeprazole 20 MG capsule Commonly known as: PRILOSEC Take 20 mg by mouth daily.   oseltamivir 75 MG capsule Commonly known as: TAMIFLU Take 1 capsule (75 mg total) by mouth 2 (two) times daily.   predniSONE 50 MG tablet Commonly known as: DELTASONE Take 1 tablet (50 mg total) by mouth daily with breakfast. Start taking on: July 15, 2022 What changed:  medication strength how much to take when to take this               Durable Medical Equipment  (From admission, onward)           Start     Ordered   07/13/22 1248  For home use only DME oxygen  Once       Question Answer Comment  Length of Need 6 Months   Mode or (Route) Nasal cannula   Liters per Minute 3   Frequency Continuous (stationary and portable oxygen unit needed)   Oxygen conserving device Yes   Oxygen delivery system Gas      07/13/22 1247   07/13/22 1248  For home use only DME Nebulizer machine  Once       Question Answer Comment  Patient needs a nebulizer to treat with the following condition COPD exacerbation (HCC)   Length of Need Lifetime      07/13/22 1247            Follow-up Information     [facility] Follow up.   Why: Home oxygen and neb machine Contact information: [address] [place] [address] [phone]                Discharge Exam: Filed Weights   07/12/22 1014  Weight: 50.3 kg   HEENT:  [place]/AT, No thrush, no icterus CV:  RRR, no rub, no S3, no S4 Lung:  bibasilar rales. No wheeze Abd:  soft/+BS, NT Ext:  No edema, no lymphangitis, no synovitis, no rash   Condition at discharge: stable  The results of significant diagnostics from this hospitalization (including imaging, microbiology, ancillary and laboratory) are listed below for reference.   Imaging Studies: DG Chest 2 View  Result Date: 07/12/2022 CLINICAL DATA:  52 year old  female with chest pain and shortness of breath. Spouse recently with flu-like illness. EXAM: CHEST - 2 VIEW COMPARISON:  None Available. FINDINGS: Large lung volumes. Normal cardiac size and mediastinal contours. Visualized tracheal air column is within normal limits. Attenuation of bronchovascular markings in the right upper lobe raising the possibility of emphysema. Asymmetric coarse increased mid and lower lung interstitial opacity. No pleural effusion or consolidation. No pneumothorax. No acute osseous abnormality identified. Negative visible bowel gas. IMPRESSION: Suspicion of chronic pulmonary hyperinflation/emphysema, with superimposed coarse bilateral mid and lower lung interstitial opacity suggestive of acute viral/atypical respiratory infection in this setting. No pleural effusion. Electronically Signed   By: Odessa Fleming M.D.   On: 07/12/2022 11:33    Microbiology: Results for orders placed or performed during the hospital encounter of 07/12/22  Resp panel by RT-PCR (RSV, Flu A&B, Covid) Anterior  Nasal Swab     Status: None   Collection Time: 07/12/22 10:40 AM   Specimen: Anterior Nasal Swab  Result Value Ref Range Status   SARS Coronavirus 2 by RT PCR NEGATIVE NEGATIVE Final    Comment: (NOTE) SARS-CoV-2 target nucleic acids are NOT DETECTED.  The SARS-CoV-2 RNA is generally detectable in upper respiratory specimens during the acute phase of infection. The lowest concentration of SARS-CoV-2 viral copies this assay can detect is 138 copies/mL. A negative result does not preclude SARS-Cov-2 infection and should not be used as the sole basis for treatment or other patient management decisions. A negative result may occur with  improper specimen collection/handling, submission of specimen other than nasopharyngeal swab, presence of viral mutation(s) within the areas targeted by this assay, and inadequate number of viral copies(<138 copies/mL). A negative result must be combined with clinical  observations, patient history, and epidemiological information. The expected result is Negative.  Fact Sheet for Patients:  BloggerCourse.com  Fact Sheet for Healthcare Providers:  SeriousBroker.it  This test is no t yet approved or cleared by the [place] FDA and  has been authorized for detection and/or diagnosis of SARS-CoV-2 by FDA under an Emergency Use Authorization (EUA). This EUA will remain  in effect (meaning this test can be used) for the duration of the COVID-19 declaration under Section 564(b)(1) of the Act, 21 U.S.C.section 360bbb-3(b)(1), unless the authorization is terminated  or revoked sooner.       Influenza A by PCR NEGATIVE NEGATIVE Final   Influenza B by PCR NEGATIVE NEGATIVE Final    Comment: (NOTE) The Xpert Xpress SARS-CoV-2/FLU/RSV plus assay is intended as an aid in the diagnosis of influenza from Nasopharyngeal swab specimens and should not be used as a sole basis for treatment. Nasal washings and aspirates are unacceptable for Xpert Xpress SARS-CoV-2/FLU/RSV testing.  Fact Sheet for Patients: BloggerCourse.com  Fact Sheet for Healthcare Providers: SeriousBroker.it  This test is not yet approved or cleared by the [place] FDA and has been authorized for detection and/or diagnosis of SARS-CoV-2 by FDA under an Emergency Use Authorization (EUA). This EUA will remain in effect (meaning this test can be used) for the duration of the COVID-19 declaration under Section 564(b)(1) of the Act, 21 U.S.C. section 360bbb-3(b)(1), unless the authorization is terminated or revoked.     Resp Syncytial Virus by PCR NEGATIVE NEGATIVE Final    Comment: (NOTE) Fact Sheet for Patients: BloggerCourse.com  Fact Sheet for Healthcare Providers: SeriousBroker.it  This test is not yet approved or cleared by  the [place] FDA and has been authorized for detection and/or diagnosis of SARS-CoV-2 by FDA under an Emergency Use Authorization (EUA). This EUA will remain in effect (meaning this test can be used) for the duration of the COVID-19 declaration under Section 564(b)(1) of the Act, 21 U.S.C. section 360bbb-3(b)(1), unless the authorization is terminated or revoked.  Performed at [facility], [address], [place], [place] [address]   Respiratory (~20 pathogens) panel by PCR     Status: Abnormal   Collection Time: 07/12/22 11:49 AM   Specimen: Nasopharyngeal Swab; Respiratory  Result Value Ref Range Status   Adenovirus NOT DETECTED NOT DETECTED Final   Coronavirus 229E NOT DETECTED NOT DETECTED Final    Comment: (NOTE) The Coronavirus on the Respiratory Panel, DOES NOT test for the novel  Coronavirus (2019 nCoV)    Coronavirus HKU1 NOT DETECTED NOT DETECTED Final   Coronavirus NL63 NOT DETECTED NOT DETECTED Final   Coronavirus  OC43 NOT DETECTED NOT DETECTED Final   Metapneumovirus NOT DETECTED NOT DETECTED Final   Rhinovirus / Enterovirus NOT DETECTED NOT DETECTED Final   Influenza A H1 2009 DETECTED (A) NOT DETECTED Final   Influenza B NOT DETECTED NOT DETECTED Final   Parainfluenza Virus 1 NOT DETECTED NOT DETECTED Final   Parainfluenza Virus 2 NOT DETECTED NOT DETECTED Final   Parainfluenza Virus 3 NOT DETECTED NOT DETECTED Final   Parainfluenza Virus 4 NOT DETECTED NOT DETECTED Final   Respiratory Syncytial Virus NOT DETECTED NOT DETECTED Final   Bordetella pertussis NOT DETECTED NOT DETECTED Final   Bordetella Parapertussis NOT DETECTED NOT DETECTED Final   Chlamydophila pneumoniae NOT DETECTED NOT DETECTED Final   Mycoplasma pneumoniae NOT DETECTED NOT DETECTED Final    Comment: Performed at [facility], [address], [place], [place] [address]    Labs: CBC: Recent Labs  Lab 07/12/22 1042 07/13/22 0459  WBC 13.0* 9.1  HGB 12.5 11.2*  HCT 39.1 35.9*   MCV 89.7 92.3  PLT 465* 457*   Basic Metabolic Panel: Recent Labs  Lab 07/12/22 1042 07/13/22 0459  NA 138 139  K 3.7 4.1  CL 100 100  CO2 28 29  GLUCOSE 101* 93  BUN 15 18  CREATININE 0.45 0.55  CALCIUM 9.2 9.1  MG  --  2.5*   Liver Function Tests: Recent Labs  Lab 07/12/22 1042  AST 21  ALT 37  ALKPHOS 87  BILITOT 0.7  PROT 8.1  ALBUMIN 3.4*   CBG: No results for input(s): "GLUCAP" in the last 168 hours.  Discharge time spent: greater than 30 minutes.  Signed: Catarina Hartshorn, MD [facility] 07/14/2022

## 2022-07-14 NOTE — Plan of Care (Signed)

## 2022-07-23 ENCOUNTER — Ambulatory Visit (HOSPITAL_COMMUNITY): Payer: Self-pay

## 2022-07-24 ENCOUNTER — Ambulatory Visit (HOSPITAL_COMMUNITY)
Admission: RE | Admit: 2022-07-24 | Discharge: 2022-07-24 | Disposition: A | Discharge status: Home / Self Care | Payer: Medicare HMO | Source: Ambulatory Visit | Attending: Nurse Practitioner | Admitting: Nurse Practitioner

## 2022-07-24 DIAGNOSIS — M79605 Pain in left leg: Secondary | ICD-10-CM | POA: Diagnosis present

## 2022-07-24 DIAGNOSIS — M79604 Pain in right leg: Secondary | ICD-10-CM | POA: Insufficient documentation

## 2022-09-07 ENCOUNTER — Ambulatory Visit: Payer: Medicare HMO | Admitting: Diagnostic Neuroimaging

## 2022-09-07 ENCOUNTER — Encounter: Payer: Self-pay | Admitting: Diagnostic Neuroimaging

## 2022-09-07 VITALS — BP 112/74 | HR 69 | Ht 62.0 in | Wt 110.0 lb

## 2022-09-07 DIAGNOSIS — R209 Unspecified disturbances of skin sensation: Secondary | ICD-10-CM

## 2022-09-07 DIAGNOSIS — G629 Polyneuropathy, unspecified: Secondary | ICD-10-CM | POA: Diagnosis not present

## 2022-09-07 NOTE — Progress Notes (Signed)
[facility]  PATIENT: [name] DOB: 1970/12/06  REFERRING CLINICIAN: Dillard Essex, NP HISTORY FROM: patient  REASON FOR VISIT: new consult   HISTORICAL  CHIEF COMPLAINT:  Chief Complaint  Patient presents with   New Patient (Initial Visit)    Pt with husband rm 7, Pt states started 2.5 yrs ago she developed numbness/tingling in feet bilaterally and has worked up to mid thigh. She c/o of cramps as well. She was weaned off gabapentin at one point and she was unable to walk. Gabapentin, hydrocodone and cymbalta is current medication which is no longer help.  She was tried on amitriptyline and  at one pregabalin point but that didn't help. Dr Gerilyn Pilgrim had completed NCV/EMG in lower extremities. Was recently tried on prednisone dose pack.   Other    MRI's were completed in 2022 and then recently jan 2024 had a lumbar MRI    HISTORY OF PRESENT ILLNESS:   52 year old female here for evaluation of lower extremity numbness and tingling and pain.  Symptoms started around August 2021.  Initially had numbness and tingling in toes and feet.  This is gradually progressed upward to her calves and thighs.  This happened over 5 months.  Since then symptoms have been fairly plateaued.  Has been using gabapentin for some pain control.  Also has been on hydrocodone with out relief.  Has had extensive testing including MRI of the neuraxis and EMG nerve conduction study.  No specific cause of been found.  Idiopathic small fiber neuropathy was raised.   REVIEW OF SYSTEMS: Full 14 system review of systems performed and negative with exception of: as per HPI.  ALLERGIES: Allergies  Allergen Reactions   Amitriptyline    Pregabalin    Varenicline    Wellbutrin [Bupropion]     HOME MEDICATIONS: Outpatient Medications Prior to Visit  Medication Sig Dispense Refill   ALPRAZolam (XANAX) 1 MG tablet Take 1 mg by mouth See admin instructions. Take 1 tablet by mouth 1 to 3 times  daily.     DULoxetine (CYMBALTA) 30 MG capsule Take 30 mg by mouth 2 (two) times daily.     estradiol (ESTRACE) 0.1 MG/GM vaginal cream Place 1 Applicatorful vaginally 3 (three) times a week.     gabapentin (NEURONTIN) 800 MG tablet Take 1,600 mg by mouth 3 (three) times daily.     gabapentin (NEURONTIN) 800 MG tablet Take 800 mg by mouth See admin instructions. 1 tablet up to 5 times daily per pt     HYDROcodone-acetaminophen (NORCO) 10-325 MG tablet Take 1 tablet by mouth 4 (four) times daily as needed.     ipratropium-albuterol (DUONEB) 0.5-2.5 (3) MG/3ML SOLN Take 3 mLs by nebulization every 6 (six) hours as needed (sob). 360 mL 1   omeprazole (PRILOSEC) 20 MG capsule Take 20 mg by mouth daily.     ALPRAZolam (XANAX) 1 MG tablet Take by mouth.     amphetamine-dextroamphetamine (ADDERALL) 20 MG tablet Take 1 tablet (20 mg total) by mouth 3 (three) times daily at 7AM, 11AM, and 3PM. 90 tablet 0   amphetamine-dextroamphetamine (ADDERALL) 20 MG tablet Take one tablet at 7 AM, one at 11 AM, and one at 3 PM. 90 tablet 0   amphetamine-dextroamphetamine (ADDERALL) 20 MG tablet Take 1 tablet (20 mg total) by mouth at 7 AM, 11 AM, and at 3 PM. 90 tablet 0   amphetamine-dextroamphetamine (ADDERALL) 30 MG tablet Take 30 mg by mouth daily.     buPROPion [facility] SR)  150 MG 12 hr tablet Take by mouth.     estradiol (ESTRACE) 0.1 MG/GM vaginal cream Place vaginally.     oseltamivir (TAMIFLU) 75 MG capsule Take 1 capsule (75 mg total) by mouth 2 (two) times daily. 7 capsule 0   predniSONE (DELTASONE) 10 MG tablet Take by mouth.     predniSONE (DELTASONE) 50 MG tablet Take 1 tablet (50 mg total) by mouth daily with breakfast. 4 tablet 0   varenicline (CHANTIX) 1 MG tablet Take 0.5 mg by mouth 2 (two) times daily.     No facility-administered medications prior to visit.    PAST MEDICAL HISTORY: Past Medical History:  Diagnosis Date   Cancer (HCC)    Cervical cancer (HCC)    COPD (chronic obstructive  pulmonary disease) (HCC)    Hypertension    Lung cancer (HCC) 2010   pt states surgery only     PAST SURGICAL HISTORY: Past Surgical History:  Procedure Laterality Date   ABDOMINAL HYSTERECTOMY     LUNG CANCER SURGERY Right 2010   LUNG CANCER SURGERY     right  lobe    FAMILY HISTORY: No family history on file.  SOCIAL HISTORY: Social History   Socioeconomic History   Marital status: Married    Spouse name: Not on file   Number of children: Not on file   Years of education: Not on file   Highest education level: Not on file  Occupational History   Not on file  Tobacco Use   Smoking status: Every Day    Packs/day: 1.00    Types: Cigarettes   Smokeless tobacco: Never  Vaping Use   Vaping Use: Never used  Substance and Sexual Activity   Alcohol use: Not Currently   Drug use: Not Currently   Sexual activity: Not on file  Other Topics Concern   Not on file  Social History Narrative   ** Merged History Encounter **       Social Determinants of Health   Financial Resource Strain: Not on file  Food Insecurity: No Food Insecurity (07/12/2022)   Hunger Vital Sign    Worried About Running Out of Food in the Last Year: Never true    Ran Out of Food in the Last Year: Never true  Transportation Needs: No Transportation Needs (07/12/2022)   PRAPARE - Administrator, Civil Service (Medical): No    Lack of Transportation (Non-Medical): No  Physical Activity: Not on file  Stress: Not on file  Social Connections: Not on file  Intimate Partner Violence: Not At Risk (07/12/2022)   Humiliation, Afraid, Rape, and Kick questionnaire    Fear of Current or Ex-Partner: No    Emotionally Abused: No    Physically Abused: No    Sexually Abused: No     PHYSICAL EXAM  GENERAL EXAM/CONSTITUTIONAL: Vitals:  Vitals:   09/07/22 0856  BP: 112/74  Pulse: 69  Weight: 110 lb (49.9 kg)  Height: 5\' 2"  (1.575 m)   Body mass index is 20.12 kg/m. Wt Readings from Last  3 Encounters:  09/07/22 110 lb (49.9 kg)  07/12/22 111 lb (50.3 kg)  03/15/21 96 lb 9.6 oz (43.8 kg)   Patient is in no distress; well developed, nourished and groomed; neck is supple  CARDIOVASCULAR: Examination of carotid arteries is normal; no carotid bruits Regular rate and rhythm, no murmurs Examination of peripheral vascular system by observation and palpation is normal EXCEPT DECR IN FEET  EYES: Ophthalmoscopic exam of optic  discs and posterior segments is normal; no papilledema or hemorrhages No results found.  MUSCULOSKELETAL: Gait, strength, tone, movements noted in Neurologic exam below  NEUROLOGIC: MENTAL STATUS:      No data to display         awake, alert, oriented to person, place and time recent and remote memory intact normal attention and concentration language fluent, comprehension intact, naming intact fund of knowledge appropriate  CRANIAL NERVE:  2nd - no papilledema on fundoscopic exam 2nd, 3rd, 4th, 6th - pupils equal and reactive to light, visual fields full to confrontation EXCEPT CENTRAL VISION LOSS, extraocular muscles intact, no nystagmus 5th - facial sensation symmetric 7th - facial strength symmetric 8th - hearing intact 9th - palate elevates symmetrically, uvula midline 11th - shoulder shrug symmetric 12th - tongue protrusion midline  MOTOR:  normal bulk and tone, full strength in the BUE, BLE  SENSORY:  normal and symmetric to light touch, pinprick, temperature, vibration  COORDINATION:  finger-nose-finger, fine finger movements normal  REFLEXES:  deep tendon reflexes present and symmetric; BUE 2, KNEES 2, RIGHT ANKLE 1, LEFT ANKLE 2  GAIT/STATION:  narrow based gait; CAUTIOUS     DIAGNOSTIC DATA (LABS, IMAGING, TESTING) - I reviewed patient records, labs, notes, testing and imaging myself where available.  Lab Results  Component Value Date   WBC 9.1 07/13/2022   HGB 11.2 (L) 07/13/2022   HCT 35.9 (L) 07/13/2022    MCV 92.3 07/13/2022   PLT 457 (H) 07/13/2022      Component Value Date/Time   NA 139 07/13/2022 0459   K 4.1 07/13/2022 0459   CL 100 07/13/2022 0459   CO2 29 07/13/2022 0459   GLUCOSE 93 07/13/2022 0459   BUN 18 07/13/2022 0459   CREATININE 0.55 07/13/2022 0459   CALCIUM 9.1 07/13/2022 0459   PROT 8.1 07/12/2022 1042   ALBUMIN 3.4 (L) 07/12/2022 1042   AST 21 07/12/2022 1042   ALT 37 07/12/2022 1042   ALKPHOS 87 07/12/2022 1042   BILITOT 0.7 07/12/2022 1042   GFRNONAA >60 07/13/2022 0459   GFRAA  11/23/2008 0400    >60        The eGFR has been calculated using the MDRD equation. This calculation has not been validated in all clinical situations. eGFR's persistently <60 mL/min signify possible Chronic Kidney Disease.   No results found for: "CHOL", "HDL", "LDLCALC", "LDLDIRECT", "TRIG", "CHOLHDL" No results found for: "HGBA1C" No results found for: "VITAMINB12" No results found for: "TSH"   01/31/21 MRI lumbar spine 1. L5-S1 moderate right and severe left neural foraminal stenosis secondary to combination of facet arthrosis and left asymmetric disc bulge. Left lateral recess narrowing at this level could contribute to S1 radiculopathy. 2. Otherwise normal lumbar spine.  04/23/21 MRI brain Small amount of nonspecific T2 hyperintense lesions of the white matter. Differential diagnosis include early chronic microangiopathy, demyelinating disease, vasculitis and other post inflammatory/infectious processes.  03/27/21 MRI cervical spine 1. Mild cervical spine degeneration without impingement or inflammation. 2. Minimally covered thyroglossal duct remnant also seen in 2015  04/23/21 MRI thoracic spine - Unremarkable MRI of the thoracic spine.   07/24/22 MRI lumbar spine - Similar multilevel degenerative change, greatest at L5-S1 where there is severe left and moderate right foraminal stenosis.    ASSESSMENT AND PLAN  52 y.o. year old female here  with:   Dx:  1. Neuropathy   2. Cold feet     PLAN:  NUMBNESS, PAIN, WEAKNESS IN FEET / LEGS (since ~  2021; ddx: small fiber neuropathy vs peripheral vascular disease) - check neuropathy labs and EMG/NCS - check bilateral lower ext ABI / arterial ultrasound - recommend pain mgmt clinic due to opioid dependence  Orders Placed This Encounter  Procedures   US ARTERIAL ABI (SCREENING LOWER EXTREMITY)   CBC with diff   CMP   Vitamin B12   MMA   Homocysteine   A1c   TSH   SPEP with IFE   ANA w/Reflex   SSA, SSB   HIV   RPR   Hepatitis B surface antibody, qualitative   Hepatitis B surface antigen   Hepatitis B core antibody, total   Hepatitis C antibody   Vitamin B1   Vitamin B6   Copper   ANCA Profile   Anti-HU, Anti-RI, Anti-YO IFA   Ceruloplasmin   Zinc   Vitamin E   NCV with EMG(electromyography)   Return for for NCV/EMG.    Suanne Marker, MD 09/07/2022, 9:52 AM Certified in Neurology, Neurophysiology and Neuroimaging  [facility] [address] [place], [place] [address] [phone]

## 2022-09-07 NOTE — Patient Instructions (Signed)
  NUMBNESS, PAIN, WEAKNESS IN FEET / LEGS (ddx: neuropathy vs peripheral vascular disease) - check neuropathy labs and EMG/NCS - check bilateral lower ext ABI / arterial ultrasound - recommend pain mgmt clinic due to opioid dependence

## 2022-09-10 LAB — ANTI-HU, ANTI-RI, ANTI-YO IFA
Anti-Hu Ab: NEGATIVE
Anti-Ri Ab: NEGATIVE
Anti-Yo Ab: NEGATIVE

## 2022-09-10 LAB — ANA W/REFLEX: ANA Titer 1: NEGATIVE

## 2022-09-11 LAB — ZINC: Zinc: 122 ug/dL — ABNORMAL HIGH (ref 44–115)

## 2022-09-16 ENCOUNTER — Ambulatory Visit (HOSPITAL_COMMUNITY)
Admission: RE | Admit: 2022-09-16 | Discharge: 2022-09-16 | Disposition: A | Discharge status: Home / Self Care | Payer: Medicare HMO | Source: Ambulatory Visit | Attending: Diagnostic Neuroimaging | Admitting: Diagnostic Neuroimaging

## 2022-09-16 ENCOUNTER — Telehealth: Payer: Self-pay

## 2022-09-16 DIAGNOSIS — R209 Unspecified disturbances of skin sensation: Secondary | ICD-10-CM | POA: Diagnosis present

## 2022-09-16 DIAGNOSIS — G629 Polyneuropathy, unspecified: Secondary | ICD-10-CM | POA: Insufficient documentation

## 2022-09-16 NOTE — Telephone Encounter (Signed)
Contacted pt home and cell, LVM rq CB

## 2022-09-16 NOTE — Telephone Encounter (Signed)
-----   Message from Penni Bombard, MD sent at 09/16/2022  3:30 PM EST ----- Normal ABI testing. Good blood flow in the legs. -VRP

## 2022-09-16 NOTE — Telephone Encounter (Signed)
Pt returned call, informed her ABI results came back normal, she has good blood flow in her legs. I informed her we are still waiting on lab results to come back. She will also get EMG/NCS completed to see if it will show cause of symptoms.  Patient verbally understood and was appreciative.

## 2022-09-25 LAB — CBC WITH DIFFERENTIAL/PLATELET
Basophils Absolute: 0 10*3/uL (ref 0.0–0.2)
Basos: 1 %
EOS (ABSOLUTE): 0.2 10*3/uL (ref 0.0–0.4)
Eos: 3 %
Hematocrit: 35.7 % (ref 34.0–46.6)
Hemoglobin: 11.6 g/dL (ref 11.1–15.9)
Immature Grans (Abs): 0 10*3/uL (ref 0.0–0.1)
Immature Granulocytes: 0 %
Lymphocytes Absolute: 2.2 10*3/uL (ref 0.7–3.1)
Lymphs: 40 %
MCH: 28.5 pg (ref 26.6–33.0)
MCHC: 32.5 g/dL (ref 31.5–35.7)
MCV: 88 fL (ref 79–97)
Monocytes Absolute: 0.5 10*3/uL (ref 0.1–0.9)
Monocytes: 9 %
Neutrophils Absolute: 2.6 10*3/uL (ref 1.4–7.0)
Neutrophils: 47 %
Platelets: 311 10*3/uL (ref 150–450)
RBC: 4.07 x10E6/uL (ref 3.77–5.28)
RDW: 13.6 % (ref 11.7–15.4)
WBC: 5.6 10*3/uL (ref 3.4–10.8)

## 2022-09-25 LAB — MULTIPLE MYELOMA PANEL, SERUM
Albumin SerPl Elph-Mcnc: 3.9 g/dL (ref 2.9–4.4)
Albumin/Glob SerPl: 1.1 (ref 0.7–1.7)
Alpha 1: 0.3 g/dL (ref 0.0–0.4)
Alpha2 Glob SerPl Elph-Mcnc: 0.8 g/dL (ref 0.4–1.0)
B-Globulin SerPl Elph-Mcnc: 1.1 g/dL (ref 0.7–1.3)
Gamma Glob SerPl Elph-Mcnc: 1.5 g/dL (ref 0.4–1.8)
Globulin, Total: 3.6 g/dL (ref 2.2–3.9)
IgA/Immunoglobulin A, Serum: 301 mg/dL (ref 87–352)
IgG (Immunoglobin G), Serum: 1462 mg/dL (ref 586–1602)
IgM (Immunoglobulin M), Srm: 108 mg/dL (ref 26–217)

## 2022-09-25 LAB — COMPREHENSIVE METABOLIC PANEL
ALT: 18 IU/L (ref 0–32)
AST: 20 IU/L (ref 0–40)
Albumin/Globulin Ratio: 1.3 (ref 1.2–2.2)
Albumin: 4.3 g/dL (ref 3.8–4.9)
Alkaline Phosphatase: 136 IU/L — ABNORMAL HIGH (ref 44–121)
BUN/Creatinine Ratio: 17 (ref 9–23)
BUN: 10 mg/dL (ref 6–24)
Bilirubin Total: <0.2 mg/dL (ref 0.0–1.2)
CO2: 28 mmol/L (ref 20–29)
Calcium: 9.8 mg/dL (ref 8.7–10.2)
Chloride: 96 mmol/L (ref 96–106)
Creatinine, Ser: 0.59 mg/dL (ref 0.57–1.00)
Globulin, Total: 3.2 g/dL (ref 1.5–4.5)
Glucose: 90 mg/dL (ref 70–99)
Potassium: 4.3 mmol/L (ref 3.5–5.2)
Sodium: 137 mmol/L (ref 134–144)
Total Protein: 7.5 g/dL (ref 6.0–8.5)
eGFR: 108 mL/min/{1.73_m2} (ref >59)

## 2022-09-25 LAB — ANCA PROFILE
Anti-MPO Antibodies: <0.2 units (ref 0.0–0.9)
Anti-PR3 Antibodies: <0.2 units (ref 0.0–0.9)
Atypical pANCA: <1:20 {titer}
C-ANCA: <1:20 {titer}
P-ANCA: <1:20 {titer}

## 2022-09-25 LAB — CERULOPLASMIN: Ceruloplasmin: 25.9 mg/dL (ref 19.0–39.0)

## 2022-09-25 LAB — HEPATITIS B CORE ANTIBODY, TOTAL: Hep B Core Total Ab: NEGATIVE

## 2022-09-25 LAB — HIV ANTIBODY (ROUTINE TESTING W REFLEX): HIV Screen 4th Generation wRfx: NONREACTIVE

## 2022-09-25 LAB — HOMOCYSTEINE: Homocysteine: 14.8 umol/L — ABNORMAL HIGH (ref 0.0–14.5)

## 2022-09-25 LAB — SJOGREN'S SYNDROME ANTIBODS(SSA + SSB)
ENA SSA (RO) Ab: <0.2 AI (ref 0.0–0.9)
ENA SSB (LA) Ab: <0.2 AI (ref 0.0–0.9)

## 2022-09-25 LAB — VITAMIN E
Vitamin E (Alpha Tocopherol): 14.1 mg/L (ref 7.0–25.1)
Vitamin E(Gamma Tocopherol): 3.3 mg/L (ref 0.5–5.5)

## 2022-09-25 LAB — COPPER, SERUM: Copper: 118 ug/dL (ref 80–158)

## 2022-09-25 LAB — TSH: TSH: 1.96 u[IU]/mL (ref 0.450–4.500)

## 2022-09-25 LAB — VITAMIN B12: Vitamin B-12: 432 pg/mL (ref 232–1245)

## 2022-09-25 LAB — HEMOGLOBIN A1C
Est. average glucose Bld gHb Est-mCnc: 114 mg/dL
Hgb A1c MFr Bld: 5.6 % (ref 4.8–5.6)

## 2022-09-25 LAB — VITAMIN B1: Thiamine: 81.7 nmol/L (ref 66.5–200.0)

## 2022-09-25 LAB — HEPATITIS C ANTIBODY: Hep C Virus Ab: NONREACTIVE

## 2022-09-25 LAB — RPR: RPR Ser Ql: NONREACTIVE

## 2022-09-25 LAB — HEPATITIS B SURFACE ANTIBODY,QUALITATIVE

## 2022-09-25 LAB — VITAMIN B6: Vitamin B6: 61.9 ug/L (ref 3.4–65.2)

## 2022-09-25 LAB — HEPATITIS B SURFACE ANTIGEN: Hepatitis B Surface Ag: NEGATIVE

## 2022-09-25 LAB — METHYLMALONIC ACID, SERUM: Methylmalonic Acid: 416 nmol/L — ABNORMAL HIGH (ref 0–378)

## 2022-10-29 ENCOUNTER — Ambulatory Visit: Payer: Medicare HMO | Admitting: Neurology

## 2022-10-29 VITALS — BP 108/63 | HR 99 | Ht 62.0 in | Wt 110.0 lb

## 2022-10-29 DIAGNOSIS — G629 Polyneuropathy, unspecified: Secondary | ICD-10-CM | POA: Diagnosis not present

## 2022-10-29 DIAGNOSIS — M79604 Pain in right leg: Secondary | ICD-10-CM | POA: Diagnosis not present

## 2022-10-29 DIAGNOSIS — E538 Deficiency of other specified B group vitamins: Secondary | ICD-10-CM

## 2022-10-29 DIAGNOSIS — K029 Dental caries, unspecified: Secondary | ICD-10-CM

## 2022-10-29 DIAGNOSIS — D649 Anemia, unspecified: Secondary | ICD-10-CM | POA: Diagnosis not present

## 2022-10-29 DIAGNOSIS — M898X9 Other specified disorders of bone, unspecified site: Secondary | ICD-10-CM

## 2022-10-29 DIAGNOSIS — E559 Vitamin D deficiency, unspecified: Secondary | ICD-10-CM

## 2022-10-29 DIAGNOSIS — M79605 Pain in left leg: Secondary | ICD-10-CM

## 2022-10-29 NOTE — Progress Notes (Addendum)
Full Name: [name] Gender: Female MRN #: [id number] Date of Birth: 1971/02/05    Visit Date: 10/29/2022 07:53 Age: 52 Years Examining Physician: Dr. Naomie Dean Referring Physician: Dr. Joycelyn Schmid Height: 5 feet 2 inch  History: From Chart: History: Patient here for emg/ncs evaluation for neuropathy.  Per chart review she developed numbness and tingling in the feet bilaterally 2.5 years ago, also cramps. gabapentin, hydrocodone and Cymbalta no longer helping, on amitriptyline and pregabalin also did not help. Initially numbness and tingling of the lower extremities in the toes and feet gradually progressing upwards towards her Thighs over 5 Months Then the Symptoms Fairly Plateaued.  Exam Showed That She Did Have Lower Extremity Reflexes.  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    Extensive testing included, vitamin E, ceruloplasmin, ANCA profile, copper, B6, B1, hep C and B, RPR, HIV, Sjogren's, SPEP with IFE, TSH A1c, homocystine, MMA, B12, CMP, CBC with differential, paraneoplastic antibodies, ANA with reflex, zinc, magnesium, CBC, BMP overall unremarkable except for elevated MMA and homocystine however B12 was 432, vitamin B1 was 81.7 which is within normal range but on the lower end.  Sometimes MMA can be elevated and homocystine elevated in  B12 deficiency, 10% of people can be B12 deficient even in the normal range.  We can test again today.  She was mildly anemic 3 months ago, however her MCV was within normal limits. ABI was normal. She is a smoker, no alcohol.  Summary: NCS showed H reflexes were absent. The right median/ulnar (palm) comparison nerve showed prolonged distal peak latency (Median Palm, 2.5 ms, N<2.2) and abnormal peak  latency difference (Median Palm-Ulnar Palm, 0.8 ms, N<0.4) with a relative median delay.  The right orthodromic median sensory nerve showed delayed distal peak latency (3.65ms, N<3.4). There was spontaneous activity on emg in the lower paraspinal lumbar muscles. All remaining nerves and remaining muscles (as indicated in the following tables) were within normal limits.       Conclusion: NCS normal except for absent H waves which could indicate early polyneuropathy or s1 radiculopathy. Note a small fiber neuropathy could be present and still evade detection by this exam.  EMG shows acute/ongoing denervation isolated to the lower lumbar paraspinals, consistent with radiculopathy. Given the lack of similar changes in limb muscles and considerable overlap in paraspinal innervation, a more precise localization cannot be identified at this time. However patient denies low back pain and radiculopathy. 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 Patient states asymptomatic. Clinical correlation suggested.       ------------------------------- Naomie Dean, M.D.  [facility] [address] [place], [place] [address] Tel: [phone] Fax: [phone]  Verbal informed consent was obtained from the patient, patient was informed of potential risk of procedure, including bruising, bleeding, hematoma formation, infection, muscle weakness, muscle pain, numbness, among others.        MNC    Nerve / Sites Muscle Latency Ref. Amplitude Ref. Rel Amp Segments Distance Velocity Ref. Area    ms ms mV mV %  cm m/s m/s mVms  R Median - APB     Wrist APB 4.1 ?4.4 6.7 ?4.0 100 Wrist - APB 7  22.5     Upper arm APB 8.7  8.1  121 Upper arm - Wrist 23.6 52 ?49 26.6  R Ulnar - ADM     Wrist ADM 2.9 ?3.3 8.7 ?6.0 100 Wrist - ADM 7   24.8     B.Elbow ADM 4.7  8.5  98.1 B.Elbow - Wrist 11 61 ?49 25.2     A.Elbow ADM 7.1   8.2  96.8 A.Elbow - B.Elbow 15 63 ?49 24.9  R Peroneal - EDB     Ankle EDB 5.1 ?6.5 4.9 ?2.0 100 Ankle - EDB 9   19.2     Fib head EDB 10.4  4.5  91.1 Fib head - Ankle 23 44 ?44 19.0     Pop fossa EDB 12.7  5.0  111 Pop fossa - Fib head 10.6 45 ?44 21.6         Pop fossa - Ankle      L Peroneal - EDB     Ankle EDB 5.4 ?6.5 4.8 ?2.0 100 Ankle - EDB 9   20.7     Fib head EDB 10.5  4.2  86.9 Fib head - Ankle 23 45 ?44 18.5     Pop fossa EDB 12.8  4.0  96.9 Pop fossa - Fib head 10.6 46 ?44 17.8         Pop fossa - Ankle      R Tibial - AH     Ankle AH 3.9 ?5.8 10.7 ?4.0 100 Ankle - AH 9   25.9     Pop fossa AH 12.4  10.3  96.2 Pop fossa - Ankle 36 42 ?41 27.1  L Tibial - AH     Ankle AH 4.2 ?5.8 14.5 ?4.0 100 Ankle - AH 9   38.6     Pop fossa AH 12.6  12.6  86.9 Pop fossa - Ankle 34.6 41 ?41 37.5                 SNC    Nerve / Sites Rec. Site Peak Lat Ref.  Amp Ref. Segments Distance Peak Diff Ref.    ms ms V V  cm ms ms  R Radial - Anatomical snuff box (Forearm)     Forearm Wrist 2.4 ?2.9 37 ?15 Forearm - Wrist 10    R Sural - Ankle (Calf)     Calf Ankle 3.4 ?4.4 10 ?6 Calf - Ankle 14    L Sural - Ankle (Calf)     Calf Ankle 3.4 ?4.4 8 ?6 Calf - Ankle 14    R Superficial peroneal - Ankle     Lat leg Ankle 4.0 ?4.4 8 ?6 Lat leg - Ankle 14    L Superficial peroneal - Ankle     Lat leg Ankle 3.7 ?4.4 7 ?6 Lat leg - Ankle 14    R Median, Ulnar - Transcarpal comparison     Median Palm Wrist 2.5 ?2.2 43 ?35 Median Palm - Wrist 8       Ulnar Palm Wrist 1.7 ?2.2 42 ?12 Ulnar Palm - Wrist 8          Median Palm - Ulnar Palm  0.8 ?0.4  R Median - Orthodromic (Dig II, Mid palm)     Dig II Wrist 3.6 ?3.4 11 ?10 Dig II - Wrist 13    R Ulnar - Orthodromic, (Dig V, Mid palm)     Dig V Wrist 2.9 ?3.1 9 ?5 Dig V - Wrist 11  F  Wave    Nerve F Lat Ref.   ms ms  R Tibial - AH 49.2 ?56.0  L Tibial - AH 48.8 ?56.0  R Ulnar - ADM 25.2 ?32.0           H Reflex    Nerve  H Lat Lat Hmax   ms ms   Left Right Ref. Left Right Ref.  Tibial - Soleus NR NR ?35.0 NR NR ?35.0         EMG Summary Table    Spontaneous MUAP Recruitment  Muscle IA Fib PSW Fasc Other Amp Dur. Poly Pattern  L. Cervical paraspinals (low) Normal None None None _______ Normal Normal Normal Normal  L. Thoracic paraspinals (low) Normal None None None _______ Normal Normal Normal Normal  L. Thoracic paraspinals (mid) Normal None None None _______ Normal Normal Normal Normal  R. Thoracic paraspinals (mid) Normal None None None _______ Normal Normal Normal Normal  L. Iliopsoas Normal None None None _______ Normal Normal Normal Normal  R. Iliopsoas Normal None None None _______ Normal Normal Normal Normal  L. Vastus medialis Normal None None None _______ Normal Normal Normal Normal  R. Vastus medialis Normal None None None _______ Normal Normal Normal Normal  L. Tibialis anterior Normal None None None _______ Normal Normal Normal Normal  R. Tibialis anterior Normal None None None _______ Normal Normal Normal Normal  L. Gastrocnemius (Medial head) Normal None None None _______ Normal Normal Normal Normal  R. Gastrocnemius (Medial head) Normal None None None _______ Normal Normal Normal Normal  L. Extensor hallucis longus Normal None None None _______ Normal Normal Normal Normal  R. Extensor hallucis longus Normal None None None _______ Normal Normal Normal Normal  L. Tibialis posterior Normal None None None _______ Normal Normal Normal Normal  R. Tibialis posterior Normal None None None _______ Normal Normal Normal Normal  L. Abductor hallucis Normal None None None _______ Normal Normal Normal Normal  R. Abductor hallucis Normal None None None _______ Normal Normal Normal Normal  L. Gluteus maximus Normal None None None _______ Normal Normal Normal Normal  R. Gluteus maximus Normal None None None _______ Normal Normal Normal Normal  L. Gluteus medius Normal None None None _______ Normal Normal  Normal Normal  R. Gluteus medius Normal None None None _______ Normal Normal Normal Normal  L. Lumbar paraspinals (low) Normal None 2+ None _______ Normal Normal Normal Normal  R. Lumbar paraspinals (low) Normal None 2+ None _______ Normal Normal Normal Normal

## 2022-10-29 NOTE — Progress Notes (Signed)
History: Patient here for emg/ncs evaluation for neuropathy.  Discussed all below in detail. Per chart review she developed numbness and tingling in the feet bilaterally 2.5 years ago, also cramps, gabapentin, hydrocodone and Cymbalta no longer helping, on amitriptyline and pregabalin also did not help.Initially numbness and tingling of the lower extremities in the toes and feet gradually progressing upwards towards her thighs over 5 Months Then the Symptoms Fairly Plateaued.  Exam Showed That She Did Have Lower Extremity Reflexes.  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  EMG nerve conduction study on 1 arm and 1 leg.  Also suspicious, per notes, for peripheral vascular disease.  Extensive testing including, vitamin E, ceruloplasmin, ANCA profile, copper, B6, B1, hep C and B, RPR, HIV, Sjogren's, SPEP with IFE, TSH A1c, homocystine, MMA, B12, CMP, CBC with differential, paraneoplastic antibodies, ANA with reflex, zinc, magnesium, CBC, BMP overall unremarkable except for elevated MMA and homocystine however B12 was 432, vitamin B1 was 81.7 which is within normal range but on the lower end.  Sometimes MMA can be elevated and homocystine and B12 deficiency, 10% of people can be B12 deficient even in the normal range.  We can test again today.  She was mildly anemic 3 months ago, however her MCV was within normal limits. ABI was normal. She is a smoker, no alcohol.  10/29/2022: Husband is here who provides much information.  No low back pain or radiation into the legs. Started in the toes and went up to mid thigh and comes. Heaviness in the legs. Nothing in the upper extremities. Feels like legs are jello. Lots of numbness and tingling. Only eats once a day. Not a good diet.  She had lung cancer 8-10 years and cervical cancer 32 years ago before symptoms occur. Still smoking. 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 No joint pain. Has weakness and heaviness in the legs. She falls a lot. Zinc elevated but uses fixodent. Lots of pain but not coming from the back.  Right leg is worse. Talks about more bone pain. Heaviness,tingling,pain. Shin and thigh bone. No fractures. No rashes.  She has Hidradenitis suppurativa (HS)  also which may cause neuropathy; "HS May Cause Skin Changes That Might Lead to Peripheral Neuropathy. Hidradenitis suppurativa (HS) may involve skin or nerve ending changes that could lead to peripheral neuropathy and changes in the transduction of specific stimuli."  Alsouhibani A, Speck P, Cole EF, et al. Quantitative sensory testing to characterize sensory changes in hidradenitis suppurativa skin lesions. JAMA [facility]at[facility]. Published online March 25, 2022. doi:[email or web address].   They also report she also has dependent rubor and her toes turn blue, she reports: Hurts to palpation below knees, Cold feet, teeth decayed, "bone pain",  Propping up makes better., Can only walk so long but no back pain or radiation into the legs, Limited exam: Lower strength 5/5, Decrease pin prick distally in the toes, Vibration intact, decrease pp and cold to mid forefoot, Intact vibration,  2+ lowers. Will reorder a few labs discussed with Dr. Marjory Lies and he will see patient again.   Discussed: Conclusion:NCS normal except for absent H waves which could indicate early polyneuropathy or s1 radiculopathy. Note a small fiber neuropathy could be present and still evade detection by this exam.  EMG shows acute/ongoing  denervation isolated to the lower lumbar paraspinals, consistent with radiculopathy. Given the lack of similar changes in limb muscles and considerable overlap in paraspinal innervation, a more precise localization cannot be identified at this  time. However patient denies low back pain and radiculopathy. 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 Patient states asymptomatic. Clinical correlation suggested.    Orders Placed This Encounter  Procedures   B12 and Folate Panel   Methylmalonic acid, serum   Homocysteine   Comprehensive metabolic panel   CBC with Differential/Platelets   Vitamin D, 25-hydroxy   ANA Comprehensive Panel   Rheumatoid factor   Angiotensin converting enzyme   Sedimentation rate   Heavy metals, blood   Vitamin C   I spent over 40 minutes of face-to-face and non-face-to-face time with patient on the  1. Pain in both lower extremities   2. Neuropathy   3. screen for B12 deficiency   4. prior Anemia, unspecified type   5. Vitamin D deficiency   6. Teeth decayed   7. Bone pain    diagnosis.  This included previsit chart review, lab review, study review, order entry, electronic health record documentation, patient education on the different diagnostic and therapeutic options, counseling and coordination of care, risks and benefits of management, compliance, or risk factor reduction. This does not include time spent on emg/nvs.

## 2022-10-30 LAB — B12 AND FOLATE PANEL: Vitamin B-12: 395 pg/mL (ref 232–1245)

## 2022-10-30 LAB — HEAVY METALS, BLOOD: Lead, Blood: 1.2 ug/dL (ref 0.0–3.4)

## 2022-10-30 LAB — ANA COMPREHENSIVE PANEL
ENA SSB (LA) Ab: <0.2 AI (ref 0.0–0.9)
Scleroderma (Scl-70) (ENA) Antibody, IgG: <0.2 AI (ref 0.0–0.9)
dsDNA Ab: <1 IU/mL (ref 0–9)

## 2022-10-30 LAB — CBC WITH DIFFERENTIAL/PLATELET
Hemoglobin: 13.2 g/dL (ref 11.1–15.9)
Monocytes: 6 %
Platelets: 374 10*3/uL (ref 150–450)

## 2022-10-30 LAB — RHEUMATOID FACTOR: Rheumatoid fact SerPl-aCnc: <10 IU/mL (ref <14.0)

## 2022-10-30 LAB — COMPREHENSIVE METABOLIC PANEL: CO2: 25 mmol/L (ref 20–29)

## 2022-10-31 LAB — CBC WITH DIFFERENTIAL/PLATELET
EOS (ABSOLUTE): 0.1 10*3/uL (ref 0.0–0.4)
Eos: 1 %
Immature Grans (Abs): 0 10*3/uL (ref 0.0–0.1)
MCH: 29.9 pg (ref 26.6–33.0)
Monocytes Absolute: 0.5 10*3/uL (ref 0.1–0.9)
Neutrophils: 68 %
WBC: 8.1 10*3/uL (ref 3.4–10.8)

## 2022-10-31 LAB — COMPREHENSIVE METABOLIC PANEL
ALT: 12 IU/L (ref 0–32)
Albumin/Globulin Ratio: 1.4 (ref 1.2–2.2)
BUN: 10 mg/dL (ref 6–24)
Globulin, Total: 3.1 g/dL (ref 1.5–4.5)

## 2022-10-31 LAB — ANGIOTENSIN CONVERTING ENZYME: Angio Convert Enzyme: 61 U/L (ref 14–82)

## 2022-10-31 LAB — SEDIMENTATION RATE: Sed Rate: 19 mm/hr (ref 0–40)

## 2022-10-31 LAB — ANA COMPREHENSIVE PANEL: Anti JO-1: <0.2 AI (ref 0.0–0.9)

## 2022-10-31 LAB — VITAMIN C

## 2022-11-01 DIAGNOSIS — M79604 Pain in right leg: Secondary | ICD-10-CM | POA: Insufficient documentation

## 2022-11-01 NOTE — Addendum Note (Signed)
Addended by: Naomie Dean B on: 11/01/2022 02:27 PM   Modules accepted: Level of Service

## 2022-11-01 NOTE — Procedures (Signed)
Full Name: [name] Gender: Female MRN #: [id number] Date of Birth: 1971/02/05    Visit Date: 10/29/2022 07:53 Age: 52 Years Examining Physician: Dr. Naomie Dean Referring Physician: Dr. Joycelyn Schmid Height: 5 feet 2 inch  History: From Chart: History: Patient here for emg/ncs evaluation for neuropathy.  Per chart review she developed numbness and tingling in the feet bilaterally 2.5 years ago, also cramps. gabapentin, hydrocodone and Cymbalta no longer helping, on amitriptyline and pregabalin also did not help. Initially numbness and tingling of the lower extremities in the toes and feet gradually progressing upwards towards her Thighs over 5 Months Then the Symptoms Fairly Plateaued.  Exam Showed That She Did Have Lower Extremity Reflexes.  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    Extensive testing included, vitamin E, ceruloplasmin, ANCA profile, copper, B6, B1, hep C and B, RPR, HIV, Sjogren's, SPEP with IFE, TSH A1c, homocystine, MMA, B12, CMP, CBC with differential, paraneoplastic antibodies, ANA with reflex, zinc, magnesium, CBC, BMP overall unremarkable except for elevated MMA and homocystine however B12 was 432, vitamin B1 was 81.7 which is within normal range but on the lower end.  Sometimes MMA can be elevated and homocystine elevated in  B12 deficiency, 10% of people can be B12 deficient even in the normal range.  We can test again today.  She was mildly anemic 3 months ago, however her MCV was within normal limits. ABI was normal. She is a smoker, no alcohol.  Summary: NCS showed H reflexes were absent. The right median/ulnar (palm) comparison nerve showed prolonged distal peak latency (Median Palm, 2.5 ms, N<2.2) and abnormal peak  latency difference (Median Palm-Ulnar Palm, 0.8 ms, N<0.4) with a relative median delay.  The right orthodromic median sensory nerve showed delayed distal peak latency (3.65ms, N<3.4). There was spontaneous activity on emg in the lower paraspinal lumbar muscles. All remaining nerves and remaining muscles (as indicated in the following tables) were within normal limits.       Conclusion: NCS normal except for absent H waves which could indicate early polyneuropathy or s1 radiculopathy. Note a small fiber neuropathy could be present and still evade detection by this exam.  EMG shows acute/ongoing denervation isolated to the lower lumbar paraspinals, consistent with radiculopathy. Given the lack of similar changes in limb muscles and considerable overlap in paraspinal innervation, a more precise localization cannot be identified at this time. However patient denies low back pain and radiculopathy. 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 Patient states asymptomatic. Clinical correlation suggested.       ------------------------------- Naomie Dean, M.D.  [facility] [address] [place], [place] [address] Tel: [phone] Fax: [phone]  Verbal informed consent was obtained from the patient, patient was informed of potential risk of procedure, including bruising, bleeding, hematoma formation, infection, muscle weakness, muscle pain, numbness, among others.        MNC    Nerve / Sites Muscle Latency Ref. Amplitude Ref. Rel Amp Segments Distance Velocity Ref. Area    ms ms mV mV %  cm m/s m/s mVms  R Median - APB     Wrist APB 4.1 ?4.4 6.7 ?4.0 100 Wrist - APB 7  22.5     Upper arm APB 8.7  8.1  121 Upper arm - Wrist 23.6 52 ?49 26.6  R Ulnar - ADM     Wrist ADM 2.9 ?3.3 8.7 ?6.0 100 Wrist - ADM 7   24.8     B.Elbow ADM 4.7  8.5  98.1 B.Elbow - Wrist 11 61 ?49 25.2     A.Elbow ADM 7.1   8.2  96.8 A.Elbow - B.Elbow 15 63 ?49 24.9  R Peroneal - EDB     Ankle EDB 5.1 ?6.5 4.9 ?2.0 100 Ankle - EDB 9   19.2     Fib head EDB 10.4  4.5  91.1 Fib head - Ankle 23 44 ?44 19.0     Pop fossa EDB 12.7  5.0  111 Pop fossa - Fib head 10.6 45 ?44 21.6         Pop fossa - Ankle      L Peroneal - EDB     Ankle EDB 5.4 ?6.5 4.8 ?2.0 100 Ankle - EDB 9   20.7     Fib head EDB 10.5  4.2  86.9 Fib head - Ankle 23 45 ?44 18.5     Pop fossa EDB 12.8  4.0  96.9 Pop fossa - Fib head 10.6 46 ?44 17.8         Pop fossa - Ankle      R Tibial - AH     Ankle AH 3.9 ?5.8 10.7 ?4.0 100 Ankle - AH 9   25.9     Pop fossa AH 12.4  10.3  96.2 Pop fossa - Ankle 36 42 ?41 27.1  L Tibial - AH     Ankle AH 4.2 ?5.8 14.5 ?4.0 100 Ankle - AH 9   38.6     Pop fossa AH 12.6  12.6  86.9 Pop fossa - Ankle 34.6 41 ?41 37.5                 SNC    Nerve / Sites Rec. Site Peak Lat Ref.  Amp Ref. Segments Distance Peak Diff Ref.    ms ms V V  cm ms ms  R Radial - Anatomical snuff box (Forearm)     Forearm Wrist 2.4 ?2.9 37 ?15 Forearm - Wrist 10    R Sural - Ankle (Calf)     Calf Ankle 3.4 ?4.4 10 ?6 Calf - Ankle 14    L Sural - Ankle (Calf)     Calf Ankle 3.4 ?4.4 8 ?6 Calf - Ankle 14    R Superficial peroneal - Ankle     Lat leg Ankle 4.0 ?4.4 8 ?6 Lat leg - Ankle 14    L Superficial peroneal - Ankle     Lat leg Ankle 3.7 ?4.4 7 ?6 Lat leg - Ankle 14    R Median, Ulnar - Transcarpal comparison     Median Palm Wrist 2.5 ?2.2 43 ?35 Median Palm - Wrist 8       Ulnar Palm Wrist 1.7 ?2.2 42 ?12 Ulnar Palm - Wrist 8          Median Palm - Ulnar Palm  0.8 ?0.4  R Median - Orthodromic (Dig II, Mid palm)     Dig II Wrist 3.6 ?3.4 11 ?10 Dig II - Wrist 13    R Ulnar - Orthodromic, (Dig V, Mid palm)     Dig V Wrist 2.9 ?3.1 9 ?5 Dig V - Wrist 11  F  Wave    Nerve F Lat Ref.   ms ms  R Tibial - AH 49.2 ?56.0  L Tibial - AH 48.8 ?56.0  R Ulnar - ADM 25.2 ?32.0           H Reflex    Nerve  H Lat Lat Hmax   ms ms   Left Right Ref. Left Right Ref.  Tibial - Soleus NR NR ?35.0 NR NR ?35.0         EMG Summary Table    Spontaneous MUAP Recruitment  Muscle IA Fib PSW Fasc Other Amp Dur. Poly Pattern  L. Cervical paraspinals (low) Normal None None None _______ Normal Normal Normal Normal  L. Thoracic paraspinals (low) Normal None None None _______ Normal Normal Normal Normal  L. Thoracic paraspinals (mid) Normal None None None _______ Normal Normal Normal Normal  R. Thoracic paraspinals (mid) Normal None None None _______ Normal Normal Normal Normal  L. Iliopsoas Normal None None None _______ Normal Normal Normal Normal  R. Iliopsoas Normal None None None _______ Normal Normal Normal Normal  L. Vastus medialis Normal None None None _______ Normal Normal Normal Normal  R. Vastus medialis Normal None None None _______ Normal Normal Normal Normal  L. Tibialis anterior Normal None None None _______ Normal Normal Normal Normal  R. Tibialis anterior Normal None None None _______ Normal Normal Normal Normal  L. Gastrocnemius (Medial head) Normal None None None _______ Normal Normal Normal Normal  R. Gastrocnemius (Medial head) Normal None None None _______ Normal Normal Normal Normal  L. Extensor hallucis longus Normal None None None _______ Normal Normal Normal Normal  R. Extensor hallucis longus Normal None None None _______ Normal Normal Normal Normal  L. Tibialis posterior Normal None None None _______ Normal Normal Normal Normal  R. Tibialis posterior Normal None None None _______ Normal Normal Normal Normal  L. Abductor hallucis Normal None None None _______ Normal Normal Normal Normal  R. Abductor hallucis Normal None None None _______ Normal Normal Normal Normal  L. Gluteus maximus Normal None None None _______ Normal Normal Normal Normal  R. Gluteus maximus Normal None None None _______ Normal Normal Normal Normal  L. Gluteus medius Normal None None None _______ Normal Normal  Normal Normal  R. Gluteus medius Normal None None None _______ Normal Normal Normal Normal  L. Lumbar paraspinals (low) Normal None 2+ None _______ Normal Normal Normal Normal  R. Lumbar paraspinals (low) Normal None 2+ None _______ Normal Normal Normal Normal

## 2022-11-03 LAB — COMPREHENSIVE METABOLIC PANEL
Alkaline Phosphatase: 133 IU/L — ABNORMAL HIGH (ref 44–121)
BUN/Creatinine Ratio: 17 (ref 9–23)
Calcium: 9.8 mg/dL (ref 8.7–10.2)
Chloride: 101 mmol/L (ref 96–106)
Glucose: 80 mg/dL (ref 70–99)

## 2022-11-03 LAB — CBC WITH DIFFERENTIAL/PLATELET
MCHC: 33.2 g/dL (ref 31.5–35.7)
RDW: 12.5 % (ref 11.7–15.4)

## 2022-11-03 LAB — ANA COMPREHENSIVE PANEL
Chromatin Ab SerPl-aCnc: <0.2 AI (ref 0.0–0.9)
ENA RNP Ab: <0.2 AI (ref 0.0–0.9)

## 2022-11-03 LAB — VITAMIN D 25 HYDROXY (VIT D DEFICIENCY, FRACTURES): Vit D, 25-Hydroxy: 9.7 ng/mL — ABNORMAL LOW (ref 30.0–100.0)

## 2022-11-03 LAB — B12 AND FOLATE PANEL: Folate: 4.2 ng/mL (ref >3.0)

## 2022-11-04 LAB — COMPREHENSIVE METABOLIC PANEL
AST: 18 IU/L (ref 0–40)
Albumin: 4.4 g/dL (ref 3.8–4.9)
Bilirubin Total: 0.2 mg/dL (ref 0.0–1.2)
Creatinine, Ser: 0.59 mg/dL (ref 0.57–1.00)
Potassium: 4.6 mmol/L (ref 3.5–5.2)
Sodium: 143 mmol/L (ref 134–144)
Total Protein: 7.5 g/dL (ref 6.0–8.5)
eGFR: 108 mL/min/{1.73_m2} (ref >59)

## 2022-11-04 LAB — METHYLMALONIC ACID, SERUM: Methylmalonic Acid: 299 nmol/L (ref 0–378)

## 2022-11-04 LAB — ANA COMPREHENSIVE PANEL
Centromere Ab Screen: <0.2 AI (ref 0.0–0.9)
ENA SM Ab Ser-aCnc: <0.2 AI (ref 0.0–0.9)
ENA SSA (RO) Ab: <0.2 AI (ref 0.0–0.9)

## 2022-11-04 LAB — CBC WITH DIFFERENTIAL/PLATELET
Basophils Absolute: 0.1 10*3/uL (ref 0.0–0.2)
Basos: 1 %
Hematocrit: 39.8 % (ref 34.0–46.6)
Immature Granulocytes: 0 %
Lymphocytes Absolute: 2 10*3/uL (ref 0.7–3.1)
Lymphs: 24 %
MCV: 90 fL (ref 79–97)
Neutrophils Absolute: 5.4 10*3/uL (ref 1.4–7.0)
RBC: 4.41 x10E6/uL (ref 3.77–5.28)

## 2022-11-04 LAB — HOMOCYSTEINE: Homocysteine: 16.8 umol/L — ABNORMAL HIGH (ref 0.0–14.5)

## 2022-11-04 LAB — HEAVY METALS, BLOOD: Arsenic: 1 ug/L (ref 0–9)

## 2023-01-20 DIAGNOSIS — K219 Gastro-esophageal reflux disease without esophagitis: Secondary | ICD-10-CM | POA: Insufficient documentation

## 2023-01-20 DIAGNOSIS — G629 Polyneuropathy, unspecified: Secondary | ICD-10-CM | POA: Insufficient documentation

## 2023-02-09 DIAGNOSIS — I10 Essential (primary) hypertension: Secondary | ICD-10-CM | POA: Insufficient documentation

## 2023-02-19 ENCOUNTER — Other Ambulatory Visit (HOSPITAL_COMMUNITY): Payer: Self-pay | Admitting: Family Medicine

## 2023-02-19 DIAGNOSIS — Z1231 Encounter for screening mammogram for malignant neoplasm of breast: Secondary | ICD-10-CM

## 2023-02-19 DIAGNOSIS — R7303 Prediabetes: Secondary | ICD-10-CM | POA: Insufficient documentation

## 2023-03-01 ENCOUNTER — Ambulatory Visit (HOSPITAL_COMMUNITY): Payer: Medicare HMO

## 2023-03-08 ENCOUNTER — Ambulatory Visit (HOSPITAL_COMMUNITY): Payer: Medicare HMO

## 2023-03-17 ENCOUNTER — Ambulatory Visit (HOSPITAL_COMMUNITY)
Admission: RE | Admit: 2023-03-17 | Discharge: 2023-03-17 | Disposition: A | Discharge status: Home / Self Care | Payer: Medicare HMO | Source: Ambulatory Visit | Attending: Family Medicine | Admitting: Family Medicine

## 2023-03-17 DIAGNOSIS — Z1231 Encounter for screening mammogram for malignant neoplasm of breast: Secondary | ICD-10-CM | POA: Diagnosis present

## 2023-03-29 ENCOUNTER — Other Ambulatory Visit (HOSPITAL_COMMUNITY): Payer: Self-pay | Admitting: Internal Medicine

## 2023-03-29 DIAGNOSIS — R928 Other abnormal and inconclusive findings on diagnostic imaging of breast: Secondary | ICD-10-CM

## 2023-04-06 ENCOUNTER — Ambulatory Visit (HOSPITAL_COMMUNITY)
Admission: RE | Admit: 2023-04-06 | Discharge: 2023-04-06 | Disposition: A | Discharge status: Home / Self Care | Payer: Medicare HMO | Source: Ambulatory Visit | Attending: Internal Medicine | Admitting: Internal Medicine

## 2023-04-06 DIAGNOSIS — R928 Other abnormal and inconclusive findings on diagnostic imaging of breast: Secondary | ICD-10-CM | POA: Diagnosis present

## 2023-08-24 DIAGNOSIS — M79605 Pain in left leg: Secondary | ICD-10-CM | POA: Diagnosis not present

## 2023-08-24 DIAGNOSIS — M79604 Pain in right leg: Secondary | ICD-10-CM | POA: Diagnosis not present

## 2023-08-24 DIAGNOSIS — Z79891 Long term (current) use of opiate analgesic: Secondary | ICD-10-CM | POA: Diagnosis not present

## 2023-08-24 DIAGNOSIS — G629 Polyneuropathy, unspecified: Secondary | ICD-10-CM | POA: Diagnosis not present

## 2023-08-24 DIAGNOSIS — G894 Chronic pain syndrome: Secondary | ICD-10-CM | POA: Diagnosis not present

## 2023-09-27 DIAGNOSIS — G894 Chronic pain syndrome: Secondary | ICD-10-CM | POA: Diagnosis not present

## 2023-09-27 DIAGNOSIS — M79604 Pain in right leg: Secondary | ICD-10-CM | POA: Diagnosis not present

## 2023-09-27 DIAGNOSIS — M79605 Pain in left leg: Secondary | ICD-10-CM | POA: Diagnosis not present

## 2023-09-27 DIAGNOSIS — G629 Polyneuropathy, unspecified: Secondary | ICD-10-CM | POA: Diagnosis not present

## 2023-09-27 DIAGNOSIS — Z79891 Long term (current) use of opiate analgesic: Secondary | ICD-10-CM | POA: Diagnosis not present

## 2023-10-26 DIAGNOSIS — G629 Polyneuropathy, unspecified: Secondary | ICD-10-CM | POA: Diagnosis not present

## 2023-10-26 DIAGNOSIS — M79605 Pain in left leg: Secondary | ICD-10-CM | POA: Diagnosis not present

## 2023-10-26 DIAGNOSIS — G894 Chronic pain syndrome: Secondary | ICD-10-CM | POA: Diagnosis not present

## 2023-10-26 DIAGNOSIS — M79604 Pain in right leg: Secondary | ICD-10-CM | POA: Diagnosis not present

## 2023-10-26 DIAGNOSIS — Z79891 Long term (current) use of opiate analgesic: Secondary | ICD-10-CM | POA: Diagnosis not present

## 2023-12-14 DIAGNOSIS — I10 Essential (primary) hypertension: Secondary | ICD-10-CM | POA: Diagnosis not present

## 2023-12-14 DIAGNOSIS — R7303 Prediabetes: Secondary | ICD-10-CM | POA: Diagnosis not present

## 2023-12-20 ENCOUNTER — Other Ambulatory Visit (HOSPITAL_COMMUNITY): Payer: Self-pay | Admitting: Family Medicine

## 2023-12-20 DIAGNOSIS — J439 Emphysema, unspecified: Secondary | ICD-10-CM | POA: Diagnosis not present

## 2023-12-20 DIAGNOSIS — G629 Polyneuropathy, unspecified: Secondary | ICD-10-CM | POA: Diagnosis not present

## 2023-12-20 DIAGNOSIS — F411 Generalized anxiety disorder: Secondary | ICD-10-CM | POA: Diagnosis not present

## 2023-12-20 DIAGNOSIS — R7989 Other specified abnormal findings of blood chemistry: Secondary | ICD-10-CM | POA: Diagnosis not present

## 2023-12-20 DIAGNOSIS — F909 Attention-deficit hyperactivity disorder, unspecified type: Secondary | ICD-10-CM | POA: Diagnosis not present

## 2023-12-20 DIAGNOSIS — K219 Gastro-esophageal reflux disease without esophagitis: Secondary | ICD-10-CM | POA: Diagnosis not present

## 2023-12-20 DIAGNOSIS — R7303 Prediabetes: Secondary | ICD-10-CM | POA: Diagnosis not present

## 2023-12-20 DIAGNOSIS — E875 Hyperkalemia: Secondary | ICD-10-CM | POA: Diagnosis not present

## 2023-12-21 DIAGNOSIS — M79604 Pain in right leg: Secondary | ICD-10-CM | POA: Diagnosis not present

## 2023-12-21 DIAGNOSIS — M79605 Pain in left leg: Secondary | ICD-10-CM | POA: Diagnosis not present

## 2023-12-21 DIAGNOSIS — G894 Chronic pain syndrome: Secondary | ICD-10-CM | POA: Diagnosis not present

## 2023-12-21 DIAGNOSIS — Z79891 Long term (current) use of opiate analgesic: Secondary | ICD-10-CM | POA: Diagnosis not present

## 2023-12-21 DIAGNOSIS — G629 Polyneuropathy, unspecified: Secondary | ICD-10-CM | POA: Diagnosis not present

## 2024-01-02 ENCOUNTER — Ambulatory Visit (HOSPITAL_COMMUNITY)

## 2024-01-02 ENCOUNTER — Encounter (HOSPITAL_COMMUNITY): Payer: Self-pay

## 2024-01-25 ENCOUNTER — Ambulatory Visit (HOSPITAL_COMMUNITY)
Admission: RE | Admit: 2024-01-25 | Discharge: 2024-01-25 | Disposition: A | Discharge status: Home / Self Care | Source: Ambulatory Visit | Attending: Family Medicine | Admitting: Family Medicine

## 2024-01-25 DIAGNOSIS — J439 Emphysema, unspecified: Secondary | ICD-10-CM | POA: Diagnosis not present

## 2024-01-25 DIAGNOSIS — I700 Atherosclerosis of aorta: Secondary | ICD-10-CM | POA: Diagnosis not present

## 2024-01-25 DIAGNOSIS — R079 Chest pain, unspecified: Secondary | ICD-10-CM | POA: Diagnosis not present

## 2024-02-15 DIAGNOSIS — M79604 Pain in right leg: Secondary | ICD-10-CM | POA: Diagnosis not present

## 2024-02-15 DIAGNOSIS — M79605 Pain in left leg: Secondary | ICD-10-CM | POA: Diagnosis not present

## 2024-02-15 DIAGNOSIS — Z79891 Long term (current) use of opiate analgesic: Secondary | ICD-10-CM | POA: Diagnosis not present

## 2024-02-15 DIAGNOSIS — G629 Polyneuropathy, unspecified: Secondary | ICD-10-CM | POA: Diagnosis not present

## 2024-02-15 DIAGNOSIS — G894 Chronic pain syndrome: Secondary | ICD-10-CM | POA: Diagnosis not present

## 2024-02-17 DIAGNOSIS — M792 Neuralgia and neuritis, unspecified: Secondary | ICD-10-CM | POA: Diagnosis not present

## 2024-04-04 DIAGNOSIS — Z79891 Long term (current) use of opiate analgesic: Secondary | ICD-10-CM | POA: Diagnosis not present

## 2024-04-04 DIAGNOSIS — G629 Polyneuropathy, unspecified: Secondary | ICD-10-CM | POA: Diagnosis not present

## 2024-04-04 DIAGNOSIS — M79605 Pain in left leg: Secondary | ICD-10-CM | POA: Diagnosis not present

## 2024-04-04 DIAGNOSIS — G894 Chronic pain syndrome: Secondary | ICD-10-CM | POA: Diagnosis not present

## 2024-04-04 DIAGNOSIS — M79604 Pain in right leg: Secondary | ICD-10-CM | POA: Diagnosis not present

## 2024-04-05 ENCOUNTER — Emergency Department (HOSPITAL_COMMUNITY)
Admission: EM | Admit: 2024-04-05 | Discharge: 2024-04-05 | Disposition: A | Discharge status: Home / Self Care | Attending: Emergency Medicine | Admitting: Emergency Medicine

## 2024-04-05 ENCOUNTER — Emergency Department (HOSPITAL_COMMUNITY)

## 2024-04-05 ENCOUNTER — Other Ambulatory Visit: Payer: Self-pay

## 2024-04-05 DIAGNOSIS — R9082 White matter disease, unspecified: Secondary | ICD-10-CM | POA: Diagnosis not present

## 2024-04-05 DIAGNOSIS — Z79899 Other long term (current) drug therapy: Secondary | ICD-10-CM | POA: Diagnosis not present

## 2024-04-05 DIAGNOSIS — I639 Cerebral infarction, unspecified: Secondary | ICD-10-CM | POA: Diagnosis not present

## 2024-04-05 DIAGNOSIS — R29818 Other symptoms and signs involving the nervous system: Secondary | ICD-10-CM | POA: Diagnosis not present

## 2024-04-05 DIAGNOSIS — H818X9 Other disorders of vestibular function, unspecified ear: Secondary | ICD-10-CM

## 2024-04-05 DIAGNOSIS — Z8541 Personal history of malignant neoplasm of cervix uteri: Secondary | ICD-10-CM | POA: Diagnosis not present

## 2024-04-05 DIAGNOSIS — R61 Generalized hyperhidrosis: Secondary | ICD-10-CM | POA: Diagnosis not present

## 2024-04-05 DIAGNOSIS — E860 Dehydration: Secondary | ICD-10-CM | POA: Diagnosis not present

## 2024-04-05 DIAGNOSIS — I10 Essential (primary) hypertension: Secondary | ICD-10-CM | POA: Diagnosis not present

## 2024-04-05 DIAGNOSIS — R110 Nausea: Secondary | ICD-10-CM | POA: Diagnosis not present

## 2024-04-05 DIAGNOSIS — R42 Dizziness and giddiness: Secondary | ICD-10-CM | POA: Diagnosis present

## 2024-04-05 DIAGNOSIS — J439 Emphysema, unspecified: Secondary | ICD-10-CM | POA: Insufficient documentation

## 2024-04-05 DIAGNOSIS — F17210 Nicotine dependence, cigarettes, uncomplicated: Secondary | ICD-10-CM | POA: Diagnosis not present

## 2024-04-05 DIAGNOSIS — R29700 NIHSS score 0: Secondary | ICD-10-CM

## 2024-04-05 DIAGNOSIS — G629 Polyneuropathy, unspecified: Secondary | ICD-10-CM | POA: Insufficient documentation

## 2024-04-05 DIAGNOSIS — J449 Chronic obstructive pulmonary disease, unspecified: Secondary | ICD-10-CM | POA: Insufficient documentation

## 2024-04-05 DIAGNOSIS — H548 Legal blindness, as defined in USA: Secondary | ICD-10-CM | POA: Diagnosis not present

## 2024-04-05 DIAGNOSIS — Z9889 Other specified postprocedural states: Secondary | ICD-10-CM | POA: Insufficient documentation

## 2024-04-05 DIAGNOSIS — R55 Syncope and collapse: Secondary | ICD-10-CM | POA: Diagnosis present

## 2024-04-05 DIAGNOSIS — R112 Nausea with vomiting, unspecified: Secondary | ICD-10-CM | POA: Diagnosis present

## 2024-04-05 DIAGNOSIS — Z85118 Personal history of other malignant neoplasm of bronchus and lung: Secondary | ICD-10-CM | POA: Insufficient documentation

## 2024-04-05 DIAGNOSIS — R2689 Other abnormalities of gait and mobility: Secondary | ICD-10-CM | POA: Insufficient documentation

## 2024-04-05 LAB — CBG MONITORING, ED: Glucose-Capillary: 152 mg/dL — ABNORMAL HIGH (ref 70–99)

## 2024-04-05 LAB — COMPREHENSIVE METABOLIC PANEL WITH GFR
ALT: 23 U/L (ref 0–44)
AST: 22 U/L (ref 15–41)
Albumin: 3.3 g/dL — ABNORMAL LOW (ref 3.5–5.0)
Alkaline Phosphatase: 88 U/L (ref 38–126)
Anion gap: 7 (ref 5–15)
BUN: 13 mg/dL (ref 6–20)
CO2: 27 mmol/L (ref 22–32)
Calcium: 8.3 mg/dL — ABNORMAL LOW (ref 8.9–10.3)
Chloride: 98 mmol/L (ref 98–111)
Creatinine, Ser: 0.54 mg/dL (ref 0.44–1.00)
GFR, Estimated: >60 mL/min (ref >60)
Glucose, Bld: 118 mg/dL — ABNORMAL HIGH (ref 70–99)
Potassium: 4 mmol/L (ref 3.5–5.1)
Sodium: 132 mmol/L — ABNORMAL LOW (ref 135–145)
Total Bilirubin: 0.5 mg/dL (ref 0.0–1.2)
Total Protein: 6.6 g/dL (ref 6.5–8.1)

## 2024-04-05 LAB — CBC WITH DIFFERENTIAL/PLATELET
Abs Immature Granulocytes: 0.02 K/uL (ref 0.00–0.07)
Basophils Absolute: 0 K/uL (ref 0.0–0.1)
Basophils Relative: 0 %
Eosinophils Absolute: 0.1 K/uL (ref 0.0–0.5)
Eosinophils Relative: 1 %
HCT: 34.8 % — ABNORMAL LOW (ref 36.0–46.0)
Hemoglobin: 10.9 g/dL — ABNORMAL LOW (ref 12.0–15.0)
Immature Granulocytes: 0 %
Lymphocytes Relative: 18 %
Lymphs Abs: 1.2 K/uL (ref 0.7–4.0)
MCH: 28.8 pg (ref 26.0–34.0)
MCHC: 31.3 g/dL (ref 30.0–36.0)
MCV: 92.1 fL (ref 80.0–100.0)
Monocytes Absolute: 0.5 K/uL (ref 0.1–1.0)
Monocytes Relative: 7 %
Neutro Abs: 4.9 K/uL (ref 1.7–7.7)
Neutrophils Relative %: 74 %
Platelets: 299 K/uL (ref 150–400)
RBC: 3.78 MIL/uL — ABNORMAL LOW (ref 3.87–5.11)
RDW: 13.3 % (ref 11.5–15.5)
WBC: 6.6 K/uL (ref 4.0–10.5)
nRBC: 0 % (ref 0.0–0.2)

## 2024-04-05 LAB — URINALYSIS, ROUTINE W REFLEX MICROSCOPIC
Bilirubin Urine: NEGATIVE
Glucose, UA: NEGATIVE mg/dL
Hgb urine dipstick: NEGATIVE
Ketones, ur: NEGATIVE mg/dL
Leukocytes,Ua: NEGATIVE
Nitrite: NEGATIVE
Protein, ur: NEGATIVE mg/dL
Specific Gravity, Urine: >1.046 — ABNORMAL HIGH (ref 1.005–1.030)
pH: 7 (ref 5.0–8.0)

## 2024-04-05 LAB — RAPID URINE DRUG SCREEN, HOSP PERFORMED
Amphetamines: NOT DETECTED
Barbiturates: NOT DETECTED
Benzodiazepines: POSITIVE — AB
Cocaine: NOT DETECTED
Opiates: NOT DETECTED
Tetrahydrocannabinol: NOT DETECTED

## 2024-04-05 LAB — MAGNESIUM: Magnesium: 1.8 mg/dL (ref 1.7–2.4)

## 2024-04-05 LAB — APTT: aPTT: 30 s (ref 24–36)

## 2024-04-05 LAB — ETHANOL: Alcohol, Ethyl (B): <15 mg/dL (ref <15)

## 2024-04-05 LAB — PROTIME-INR
INR: 1.1 (ref 0.8–1.2)
Prothrombin Time: 14.8 s (ref 11.4–15.2)

## 2024-04-05 MED ORDER — MECLIZINE HCL 12.5 MG PO TABS
25.0000 mg | ORAL_TABLET | Freq: Once | ORAL | Status: AC
  Administered 2024-04-05: 25 mg via ORAL
  Filled 2024-04-05: qty 2

## 2024-04-05 MED ORDER — ONDANSETRON 4 MG PO TBDP: 4.0000 mg | ORAL_TABLET | Freq: Three times a day (TID) | ORAL | 0 refills | Status: AC | PRN

## 2024-04-05 MED ORDER — TENECTEPLASE FOR STROKE
0.2500 mg/kg | PACK | Freq: Once | INTRAVENOUS | Status: DC
Start: 2024-04-05 — End: 2024-04-05

## 2024-04-05 MED ORDER — SODIUM CHLORIDE 0.9 % IV BOLUS
500.0000 mL | Freq: Once | INTRAVENOUS | Status: AC
  Administered 2024-04-05: 500 mL via INTRAVENOUS

## 2024-04-05 MED ORDER — MECLIZINE HCL 25 MG PO TABS: 25.0000 mg | ORAL_TABLET | Freq: Three times a day (TID) | ORAL | 0 refills | Status: AC | PRN

## 2024-04-05 MED ORDER — METOCLOPRAMIDE HCL 5 MG/ML IJ SOLN: 10.0000 mg | Freq: Once | INTRAMUSCULAR | Status: DC

## 2024-04-05 MED ORDER — IOHEXOL 350 MG/ML SOLN
75.0000 mL | Freq: Once | INTRAVENOUS | Status: AC | PRN
Start: 2024-04-05 — End: 2024-04-05
  Administered 2024-04-05: 75 mL via INTRAVENOUS

## 2024-04-05 MED ORDER — TENECTEPLASE FOR STROKE
PACK | INTRAVENOUS | Status: AC
  Filled 2024-04-05: qty 10

## 2024-04-05 NOTE — ED Notes (Signed)
 CT aware of code stroke

## 2024-04-05 NOTE — Discharge Instructions (Addendum)
 Thank you for coming to [facility] Emergency Department. You were seen for balance trouble and room spinning dizziness.  You do have vertigo.  MRI did not show any signs of stroke.  We have prescribed meclizine  25 mg 3 times per day as needed for dizziness.  We have also prescribed Zofran  4 mg under the tongue every 6-8 hours as needed for nausea and vomiting.  Please stay well-hydrated at home, as your labs demonstrated that you were very dehydrated.  We have placed a referral for vestibular physical therapy and they will call you to make an appointment. Please follow up with your primary care provider within 1 week.   Do not hesitate to return to the ED or call 911 if you experience: -Worsening symptoms -Asymmetric numbness/tingling, asymmetric weakness, slurred speech, facial droop, confusion -Changes to your vision -Lightheadedness, passing out -Fevers/chills -Anything else that concerns you

## 2024-04-05 NOTE — ED Triage Notes (Signed)
 Pt arrived [facility] for c/o dizziness, that causes nausea and vomiting . EDP at bedside. LKW at 3pm. 20 G . Left wrist, zofran  4 mg  and NS 500 ml bolus given in route.

## 2024-04-05 NOTE — Progress Notes (Signed)
 [facility] Telemedicine Consult   Requesting Provider: Sid Boning Consult Participants: patient, husband, bedside RN Location of the provider: [facility] Location of the patient: [name]  This consult was provided via telemedicine with 2-way video and audio communication. The patient/family was informed that care would be provided in this way and agreed to receive care in this manner.    Chief Complaint: Dizziness and imbalance.  HPI: Ms. [name] is a 53 year old Caucasian lady with past medical history of non-small cell lung cancer and peripheral neuropathy who presented with sudden onset of severe vertigo, dizziness, gait imbalance and leaning to 1 side and nausea at 3 PM today.  Patient was treated with 4 mg Zofran  and IV normal saline 500 cc and symptoms appear to be improving partially at least but she still complains of vertigo with head movement in any direction and feels off balance.  She denies any headache, double vision, slurred speech, extremity weakness or numbness.  She is legally blind at baseline but denies any significant worsening vision today.  She has no prior history of strokes TIA seizures.  She does have a history of peripheral neuropathy and has seen Dr. Margaret and Dr. Ines  last year..  She denies any ear pain, tinnitus, hearing loss, history of inner ear infections or head trauma. She does smoke more than a pack per day for several decades. LKW: 1500 tnk given?: No, too mild to treat and patient refused IR Thrombectomy? No, clinical exam not compatible with LVO Modified Rankin Scale: 0-Completely asymptomatic and back to baseline post- stroke Time of teleneurologist evaluation: 1645  Exam: Vitals:   04/05/24 1729 04/05/24 1732  BP:    Pulse: 92   Resp: 19   Temp:  (!) 97.3 F (36.3 C)  SpO2: 100%     General: Frail middle-age Caucasian lady not in distress. She is awake alert oriented to time place and person.  Patient  language appear normal.  There is no dysarthria or aphasia.  Eye movements are discussion of risk benefits range without obvious nystagmus but according to ER MD exam she had a positive HINT test.  Face is symmetric without weakness.  Tongue is midline. Motor system exam shows symmetric upper and lower extremity strength without focal weakness.  5 finger movements are diminished.  Finger-to-nose and eatable coordination seem accurate.  Sensation is intact bilaterally.  Gait deferred due to patient having severe subjective dizziness.  NIHSS 1A: Level of Consciousness - 0 1B: Ask Month and Age - 0 1C: 'Blink Eyes' & 'Squeeze Hands' - 0 2: Test Horizontal Extraocular Movements - 0 3: Test Visual Fields - 0 4: Test Facial Palsy - 0 5A: Test Left Arm Motor Drift - 0 5B: Test Right Arm Motor Drift - 0 6A: Test Left Leg Motor Drift - 0 6B: Test Right Leg Motor Drift - 0 7: Test Limb Ataxia - 0 8: Test Sensation - 0 9: Test Language/Aphasia- 0 10: Test Dysarthria - 0 11: Test Extinction/Inattention - 0 NIHSS score: 0   Imaging Reviewed: CT head noncontrast study shows no acute abnormality.  Aspect score of 10.  Labs reviewed in epic and pertinent values follow: CT angiogram brain and neck my preliminary review shows no evidence of LVO.   Assessment: 53 year old Caucasian lady with sudden onset of severe vertigo nausea and gait ataxia without any other focal lateralizing symptoms.  Possibilities include small brainstem infarct versus peripheral vestibular dysfunction.  CT angiogram shows no large vessel occlusion.  Recommendations: I had a long discussion with patient and her husband regarding the differential diagnosis and possibility of a small brainstem infarct and discussed risk-benefit of thrombolysis with IV TNK.  The patient felt she had improved significantly and declined TNK administration at this time after after careful discussion of risk-benefit..  I recommend stat MRI scan of the  brain to evaluate for small stroke in the brainstem.  Recommend ongoing observation and if MRI is negative patient could be discharged home if she can ambulate safely.  If MRI does show small stroke she needs to be admitted to the medical hospitalist team for further stroke workup and treatment.  Long discussion with patient and husband and answered questions. Discussed with Dr.Naasz ER MD  This patient is receiving care for possible acute neurological changes. There was 55 minutes of care by this provider at the time of service, including time for direct evaluation via telemedicine, review of medical records, imaging studies and discussion of findings with providers, the patient and/or family.  Eather Popp MD [facility] [phone]  If 7pm- 7am, please page neurology on call as listed in AMION.

## 2024-04-05 NOTE — ED Provider Notes (Signed)
 [place] EMERGENCY DEPARTMENT AT [facility] Provider Note   CSN: [id number] Arrival date & time: 04/05/24  1613    History  Chief Complaint  Patient presents with   Near Syncope   Emesis    [name] is a 53 y.o. female with cervical cancer, COPD, opioid dependence, HTN, lung cancer who presents with acute onset balance problems and room-spinning dizziness aw nausea/vomiting. No h/o similar. No headache, other visual changes though patient notes she is legally blind at baseline so it is hard to tell. She did not fall or hit her head or lose consciousness. No asymmetric weakness, numbness/tingling, slurred speech, facial droop. She states it is not necessarily made worse by head movement and has been constant since 1.5 hours ago (LKN ~3 pm). Denies tinnitus or ear pain. States that earlier she felt like her right ear was clogged or something and popped it and it was fine. CBG 152 on arrival to ED.   Noted to have rightward horizontal beating nystagmus. Head impulse test showed no corrective saccade bilaterally. Negative test of skew. Code stroke activated.    Past Medical History:  Diagnosis Date   Cancer (HCC)    Cervical cancer (HCC)    COPD (chronic obstructive pulmonary disease) (HCC)    Hypertension    Lung cancer (HCC) 2010   pt states surgery only        Home Medications Prior to Admission medications   Medication Sig Start Date End Date Taking? Authorizing Provider  ALPRAZolam  (XANAX ) 1 MG tablet Take 1 mg by mouth See admin instructions. Take 1 tablet by mouth 1 to 3 times daily. 06/24/22   [provider]  DULoxetine  (CYMBALTA ) 30 MG capsule Take 30 mg by mouth 2 (two) times daily. 06/24/22   [provider]  estradiol (ESTRACE) 0.1 MG/GM vaginal cream Place 1 Applicatorful vaginally 3 (three) times a week. 03/13/21   [provider]  gabapentin  (NEURONTIN ) 800 MG tablet Take 1,600 mg by mouth 3 (three) times daily. 02/28/21    [provider]  gabapentin  (NEURONTIN ) 800 MG tablet Take 800 mg by mouth See admin instructions. 1 tablet up to 5 times daily per pt 05/30/22   [provider]  HYDROcodone -acetaminophen  (NORCO) 10-325 MG tablet Take 1 tablet by mouth 4 (four) times daily as needed. 06/26/22   [provider]  ipratropium-albuterol  (DUONEB) 0.5-2.5 (3) MG/3ML SOLN Take 3 mLs by nebulization every 6 (six) hours as needed (sob). 07/14/22   Evonnie Lenis, MD  omeprazole (PRILOSEC) 20 MG capsule Take 20 mg by mouth daily. 05/27/22   [provider]      Allergies    Amitriptyline, Pregabalin, Varenicline, and Wellbutrin [bupropion]    Review of Systems   Review of Systems A 10 point review of systems was performed and is negative unless otherwise reported in HPI.  Physical Exam Updated Vital Signs BP 108/87   Pulse 83   Ht 5' 7 (1.702 m)   Wt 50 kg   SpO2 93%   BMI 17.26 kg/m  Physical Exam General: Uncomfortable appearing female, lying in bed.  HEENT: PERRLA, EOMI, right-ward beating horizontal nystagmus. Head impulse test with no corrective saccade bilaterally. Negative test of skew bilaterally. Sclera anicteric, dry mucous membranes, trachea midline. Clear TMs bilaterally.  Cardiology: RRR, no murmurs/rubs/gallops. BL radial and DP pulses equal bilaterally.  Resp: Normal respiratory rate and effort. CTAB, no wheezes, rhonchi, crackles.  Abd: Soft, non-tender, non-distended. No rebound tenderness or guarding.  GU:  Deferred. MSK: No peripheral edema or signs of trauma. Extremities without deformity or TTP. No cyanosis or clubbing. Skin: warm, dry.  Neuro: A&Ox4, CNs II-XII grossly intact. 5/5 strength all extremities. Sensation grossly intact. Normal speech. Psych: Normal mood and affect.   ED Results / Procedures / Treatments   Labs (all labs ordered are listed, but only abnormal results are displayed) Labs Reviewed  CBG MONITORING, ED - Abnormal; Notable for the  following components:      Result Value   Glucose-Capillary 152 (*)    All other components within normal limits  CBC WITH DIFFERENTIAL/PLATELET  COMPREHENSIVE METABOLIC PANEL WITH GFR  PROTIME-INR  MAGNESIUM  ETHANOL  RAPID URINE DRUG SCREEN, HOSP PERFORMED  URINALYSIS, ROUTINE W REFLEX MICROSCOPIC    EKG EKG Interpretation Date/Time:  Wednesday April 05 2024 17:21:58 EDT Ventricular Rate:  94 PR Interval:  146 QRS Duration:  104 QT Interval:  373 QTC Calculation: 467 R Axis:   86  Text Interpretation: Sinus rhythm RSR' in V1 or V2, right VCD or RVH Confirmed by Franklyn Gills [phone]) on 04/05/2024 6:05:29 PM  Radiology CTH: NAICP, chronic small vessel ischemia.  CTA H&N: IMPRESSION: 1. No large vessel occlusion or significant stenosis in the head or neck. 2.  Emphysema (ICD10-J43.9).  MRI Brain: IMPRESSION: 1. No acute intracranial abnormality. 2. Mild cerebral white matter T2 signal changes, nonspecific though may reflect chronic small vessel ischemia, migraines, or prior infection/inflammation.  Procedures Procedures    Medications Ordered in ED Medications  iohexol  (OMNIPAQUE ) 350 MG/ML injection 75 mL (75 mLs Intravenous Contrast Given 04/05/24 1706)  meclizine  (ANTIVERT ) tablet 25 mg (25 mg Oral Given 04/05/24 1945)  sodium chloride  0.9 % bolus 500 mL (0 mLs Intravenous Stopped 04/05/24 2049)    ED Course/ Medical Decision Making/ A&P                          Medical Decision Making Amount and/or Complexity of Data Reviewed Labs: ordered. Radiology: ordered. Decision-making details documented in ED Course.  Risk Prescription drug management.    This patient presents to the ED for concern of vertigo/nystagmus, this involves an extensive number of treatment options, and is a complaint that carries with it a high risk of complications and morbidity.  I considered the following differential and admission for this acute, potentially life threatening  condition. Pt w/ nystagmus and very uncomfortable on my exam.   MDM:    Decision made to activate code stroke due to head impulse test demonstrating no corrective saccade bilaterally, which is concerning for a central cause of vertigo. She has unidirectional nystagmus which is reassuring however and no other focal neuro deficits. LKN 3 pm. No AC. Glucose 152 mg/dL.   CTH and CTA H&N with NAICP. Will send to MRI to r/o stroke per recommendation of teleneurology.   Pt with no acute findings on her MRI. No e/o posterior stroke, basilar artery occlusion or brain tumor. Consider peripheral vertigo, consider BPPV, meniere's syndrome, labyrinthitis, vestibular neuritis, ear foreign body, or head trauma. Ear exam is normal with no ear pain or loss of hearing/tinnitus, if peripheral, believe likely BPPV.  Other causes include anemia, hyperviscosity syndrome, metabolic such as hypoglycemia. Reassuringly her lab w/u is good with no significant electrolyte derangements or UTI.   On reevaluation patient's sxs have completely resolved after fluids/meclizine  and she has no nystagmus. Also consider possible contribution of dehydration as patient states she doesn't drink well and she did improve  w/ fluids too. She is requesting to be discharged. Discussed possible BPPV and reassuring imaging/labs. Advised f/u with PCP, meclizine  PO PRN, and referred her to vestibular PT. She ambulates to bathroom. Patient is given strict return precautions and advised to f/u with PCP within 1 week.   Clinical Course as of 04/10/24 1616  Wed Apr 05, 2024  1907 MR BRAIN WO CONTRAST 1. No acute intracranial abnormality. 2. Mild cerebral white matter T2 signal changes, nonspecific though may reflect chronic small vessel ischemia, migraines, or prior infection/inflammation.   [HN]    Clinical Course User Index [HN] Franklyn Sid SAILOR, MD    Labs: I Ordered, and personally interpreted labs.  The pertinent results include:  those  listed above  Imaging Studies ordered: I ordered imaging studies including CTH, CTA H&N I independently visualized and interpreted imaging. I agree with the radiologist interpretation  Additional history obtained from chart review, EMS.   Cardiac Monitoring: The patient was maintained on a cardiac monitor.  I personally viewed and interpreted the cardiac monitored which showed an underlying rhythm of: NSR  Reevaluation: After the interventions noted above, I reevaluated the patient and found that they have :resolved  Social Determinants of Health: Lives indepedently  Disposition:  DC w/ discharge instructions/return precautions. All questions answered to patient's satisfaction.    Co morbidities that complicate the patient evaluation  Past Medical History:  Diagnosis Date   Cancer (HCC)    Cervical cancer (HCC)    COPD (chronic obstructive pulmonary disease) (HCC)    Hypertension    Lung cancer (HCC) 2010   pt states surgery only      Medicines Meds ordered this encounter  Medications   metoCLOPramide  (REGLAN ) injection 10 mg    I have reviewed the patients home medicines and have made adjustments as needed  Problem List / ED Course: Problem List Items Addressed This Visit   None Visit Diagnoses       Vertigo    -  Primary     Dehydration                       This note was created using dictation software, which may contain spelling or grammatical errors.    Franklyn Sid SAILOR, MD 04/10/24 [phone]

## 2024-04-05 NOTE — ED Notes (Signed)
 Pt going to CT 1657

## 2024-04-05 NOTE — ED Notes (Signed)
 Pt oxygen in 80's. 2 lpm of nasal cannula to keep above 90%.

## 2024-05-23 DIAGNOSIS — Z79891 Long term (current) use of opiate analgesic: Secondary | ICD-10-CM | POA: Diagnosis not present

## 2024-05-23 DIAGNOSIS — G894 Chronic pain syndrome: Secondary | ICD-10-CM | POA: Diagnosis not present

## 2024-05-23 DIAGNOSIS — M79605 Pain in left leg: Secondary | ICD-10-CM | POA: Diagnosis not present

## 2024-05-23 DIAGNOSIS — G629 Polyneuropathy, unspecified: Secondary | ICD-10-CM | POA: Diagnosis not present

## 2024-05-23 DIAGNOSIS — M79604 Pain in right leg: Secondary | ICD-10-CM | POA: Diagnosis not present

## 2024-06-14 DIAGNOSIS — R7303 Prediabetes: Secondary | ICD-10-CM | POA: Diagnosis not present

## 2024-06-14 DIAGNOSIS — I10 Essential (primary) hypertension: Secondary | ICD-10-CM | POA: Diagnosis not present
